# Patient Record
Sex: Female | Born: 1965 | Race: White | Hispanic: No | Marital: Married | Smoking: Never smoker
Health system: Southern US, Community
[De-identification: ages and names within clinical notes are randomized; demographics above are authoritative.]

## PROBLEM LIST (undated history)

## (undated) DIAGNOSIS — F4310 Post-traumatic stress disorder, unspecified: Secondary | ICD-10-CM

## (undated) DIAGNOSIS — I10 Essential (primary) hypertension: Secondary | ICD-10-CM

## (undated) DIAGNOSIS — F319 Bipolar disorder, unspecified: Secondary | ICD-10-CM

## (undated) DIAGNOSIS — F419 Anxiety disorder, unspecified: Secondary | ICD-10-CM

## (undated) DIAGNOSIS — N189 Chronic kidney disease, unspecified: Secondary | ICD-10-CM

## (undated) HISTORY — PX: BLADDER SURGERY: SHX569

## (undated) HISTORY — DX: Chronic kidney disease, unspecified: N18.9

## (undated) HISTORY — DX: Anxiety disorder, unspecified: F41.9

## (undated) HISTORY — DX: Bipolar disorder, unspecified: F31.9

## (undated) HISTORY — DX: Post-traumatic stress disorder, unspecified: F43.10

## (undated) HISTORY — DX: Essential (primary) hypertension: I10

## (undated) HISTORY — PX: OTHER SURGICAL HISTORY: SHX169

---

## 2009-10-10 ENCOUNTER — Emergency Department (HOSPITAL_COMMUNITY): Admission: EM | Admit: 2009-10-10 | Discharge: 2009-10-10 | Payer: Self-pay | Admitting: Emergency Medicine

## 2012-12-30 ENCOUNTER — Ambulatory Visit: Payer: Self-pay | Admitting: Adult Health

## 2013-12-20 LAB — CBC AND DIFFERENTIAL
HEMATOCRIT: 39 % (ref 36–46)
HEMOGLOBIN: 13.2 g/dL (ref 12.0–16.0)
Neutrophils Absolute: 2 /uL
Platelets: 300 10*3/uL (ref 150–399)
WBC: 3.8 10*3/mL

## 2013-12-20 LAB — HEMOGLOBIN A1C: Hemoglobin A1C: 5.2

## 2013-12-20 LAB — TSH: TSH: 1.74 u[IU]/mL (ref 0.41–5.90)

## 2014-01-17 LAB — HEPATIC FUNCTION PANEL
ALT: 21 U/L (ref 7–35)
AST: 27 U/L (ref 13–35)
Alkaline Phosphatase: 86 U/L (ref 25–125)
Bilirubin, Total: 0.6 mg/dL

## 2014-01-17 LAB — BASIC METABOLIC PANEL
BUN: 15 mg/dL (ref 4–21)
Creatinine: 1.5 mg/dL — AB (ref 0.5–1.1)
Glucose: 106 mg/dL
POTASSIUM: 4.8 mmol/L (ref 3.4–5.3)
SODIUM: 142 mmol/L (ref 137–147)

## 2014-04-24 ENCOUNTER — Ambulatory Visit: Payer: Self-pay | Admitting: Family Medicine

## 2014-05-09 ENCOUNTER — Ambulatory Visit (INDEPENDENT_AMBULATORY_CARE_PROVIDER_SITE_OTHER): Payer: Medicare Other | Admitting: Internal Medicine

## 2014-05-09 ENCOUNTER — Encounter (INDEPENDENT_AMBULATORY_CARE_PROVIDER_SITE_OTHER): Payer: Self-pay

## 2014-05-09 ENCOUNTER — Encounter: Payer: Self-pay | Admitting: Internal Medicine

## 2014-05-09 VITALS — BP 98/66 | HR 114 | Ht 63.5 in | Wt 169.0 lb

## 2014-05-09 DIAGNOSIS — R0600 Dyspnea, unspecified: Secondary | ICD-10-CM

## 2014-05-09 DIAGNOSIS — E669 Obesity, unspecified: Secondary | ICD-10-CM | POA: Insufficient documentation

## 2014-05-09 NOTE — Patient Instructions (Signed)
We will schedule you for a Pulmonary function test and a 6 min walk test. We will send you for a 2D echo at Cardiology next door to our office. We will see you back in 1 month.

## 2014-05-09 NOTE — Progress Notes (Signed)
Date: 05/09/2014  MRN# [id number] Shelley [name] 12-26-1965  [facility] (FNP: Loney LaurenceMargaret Campbell)  Shelley NimsMargaret G [name] is a 49 y.o. old female seen in consultation for dysypnea.  CC:  Chief Complaint  Patient presents with  . Advice Only    Pt has had sob for 6 weeks, non-productive cough, and wheezing. She denies chest tightness.    HPI:  Patient is a pleasant 49 year old female referred for further evaluation of dyspnea by [facility]. Patient is accompanied today by her fianc. History per the patient. Patient states the dyspnea is gradual in onset started about 4-6 weeks ago, has noticed some very mild leg swelling and mild wheezing (when laying flat).  The patient cannot recall any inciting factors for dyspnea 4-6 weeks ago. She states that walking up a flight of stairs or more 10-15 yards will cause her to become short of breath. She is currently on no oral contraceptives. She does have a mild cough, for which she states it is productive but swallows the sputum and can't characterize the sputum. Dyspnea again, has been gradual. Last week she saw her primary care office and was given one albuterol nebulizer treatment, with little relief; she was also given a prescription for albuterol inhaler but has not picked up yet.  Patient denies any previous episodes of dyspnea, history of pulmonary embolism or deep venous thrombosis.  She is a never smoker but was exposed to secondhand smoke from appearance for 35 years, and currently expose from her fianc.  PMHX:   Past Medical History  Diagnosis Date  . Hypertension   . PTSD (post-traumatic stress disorder)   . Bipolar 1 disorder   . Anxiety    Surgical Hx:  Past Surgical History  Procedure Laterality Date  . None     Family Hx:  No family history on file. Social Hx:   History  Substance Use Topics  . Smoking status: Never Smoker   . Smokeless tobacco: Never Used  .  Alcohol Use: No   Medication:   Current Outpatient Rx  Name  Route  Sig  Dispense  Refill  . ARIPiprazole (ABILIFY) 5 MG tablet   Oral   Take 5 mg by mouth daily.         Marland Kitchen. buPROPion (WELLBUTRIN SR) 150 MG 12 hr tablet   Oral   Take 150 mg by mouth 2 (two) times daily.         Marland Kitchen. lisinopril (PRINIVIL,ZESTRIL) 10 MG tablet   Oral   Take 10 mg by mouth daily.         . naproxen (NAPROSYN) 500 MG tablet   Oral   Take 500 mg by mouth daily as needed.         . prazosin (MINIPRESS) 1 MG capsule   Oral   Take 3 mg by mouth daily.         Marland Kitchen. topiramate (TOPAMAX) 100 MG tablet   Oral   Take 100 mg by mouth daily.         . traZODone (DESYREL) 100 MG tablet   Oral   Take 200 mg by mouth daily.         Marland Kitchen. venlafaxine XR (EFFEXOR-XR) 150 MG 24 hr capsule   Oral   Take 150 mg by mouth daily.             Allergies:  Review of patient's allergies indicates no known allergies.  Review of Systems: Gen:  Denies  fever, sweats, chills HEENT: Denies blurred vision, double vision, ear pain, eye pain, hearing loss, nose bleeds, sore throat Cvc:  No dizziness, chest pain or heaviness Resp:   Denies cough or sputum porduction, shortness of breath Gi: Denies swallowing difficulty, stomach pain, nausea or vomiting, diarrhea, constipation, bowel incontinence Gu:  Denies bladder incontinence, burning urine Ext:   No Joint pain, stiffness or swelling Skin: No skin rash, easy bruising or bleeding or hives Endoc:  No polyuria, polydipsia , polyphagia or weight change Psych: No depression, insomnia or hallucinations  Other:  All other systems negative  Physical Examination:   VS: BP 98/66 mmHg  Pulse 114  Ht 5' 3.5" (1.613 m)  Wt 169 lb (76.658 kg)  BMI 29.46 kg/m2  SpO2 98%  General Appearance: No distress  Neuro:without focal findings, mental status, speech normal, alert and oriented, cranial nerves 2-12 intact, reflexes normal and symmetric, sensation grossly normal   HEENT: PERRLA, EOM intact, no ptosis, no other lesions noticed; Mallampati 3 Pulmonary: normal breath sounds., diaphragmatic excursion normal.No wheezing, No rales;   Sputum Production:   CardiovascularNormal S1,S2.  No m/r/g.  Abdominal aorta pulsation normal.    Abdomen: Benign, Soft, non-tender, No masses, hepatosplenomegaly, No lymphadenopathy Renal:  No costovertebral tenderness  GU:  No performed at this time. Endoc: No evident thyromegaly, no signs of acromegaly or Cushing features Skin:   warm, no rashes, no ecchymosis  Extremities: normal, no cyanosis, clubbing, no edema, warm with normal capillary refill. Other findings:none    Rad results:  (The following images and results were reviewed by Dr. Dema Severin).  CXR 2 View 04/24/14 FINDINGS: Midline trachea. Normal heart size and mediastinal contours. No pleural effusion or pneumothorax. Clear lungs.  IMPRESSION: No acute cardiopulmonary disease.   Assessment and Plan: No problem-specific assessment & plan notes found for this encounter.   Updated Medication List Outpatient Encounter Prescriptions as of 05/09/2014  Medication Sig  . ARIPiprazole (ABILIFY) 5 MG tablet Take 5 mg by mouth daily.  Marland Kitchen buPROPion (WELLBUTRIN SR) 150 MG 12 hr tablet Take 150 mg by mouth 2 (two) times daily.  Marland Kitchen lisinopril (PRINIVIL,ZESTRIL) 10 MG tablet Take 10 mg by mouth daily.  . naproxen (NAPROSYN) 500 MG tablet Take 500 mg by mouth daily as needed.  . prazosin (MINIPRESS) 1 MG capsule Take 3 mg by mouth daily.  Marland Kitchen topiramate (TOPAMAX) 100 MG tablet Take 100 mg by mouth daily.  . traZODone (DESYREL) 100 MG tablet Take 200 mg by mouth daily.  Marland Kitchen venlafaxine XR (EFFEXOR-XR) 150 MG 24 hr capsule Take 150 mg by mouth daily.    Orders for this visit: Orders Placed This Encounter  Procedures  . 2D Echocardiogram without contrast    Standing Status: Future     Number of Occurrences:      Standing Expiration Date: 05/10/2015    Scheduling Instructions:      Procedure to be done at [facility].    Order Specific Question:  Type of Echo    Answer:  Complete    Order Specific Question:  Where should this test be performed    Answer:  Other    Order Specific Question:  Reason for exam-Echo    Answer:  Dyspnea  786.09 / R06.00  . Pulmonary function test    Standing Status: Future     Number of Occurrences:      Standing Expiration Date: 05/10/2015    Scheduling Instructions:     To be done in  [place]. This was not scheduled, patient needs an AM appt, not sure when Lashun Mccants next morning is. Recall entered.    Order Specific Question:  Where should this test be performed?    Answer:  Other    Order Specific Question:  Full PFT: includes the following: basic spirometry, spirometry pre & post bronchodilator, diffusion capacity (DLCO), lung volumes    Answer:  Full PFT    Order Specific Question:  MIP/MEP    Answer:  No    Order Specific Question:  6 minute walk    Answer:  Yes    Order Specific Question:  ABG    Answer:  No    Order Specific Question:  Diffusion capacity (DLCO)    Answer:  No    Order Specific Question:  Lung volumes    Answer:  No    Order Specific Question:  Methacholine challenge    Answer:  No     Thank  you for the consultation and for allowing [facility] to assist in the care of your patient. Our recommendations are noted above.  Please contact us if we can be of further service.   Stephanie Acre, MD [facility] Office Number: [phone]

## 2014-05-09 NOTE — Assessment & Plan Note (Signed)
OBESITY  Wt: 169lbs BMI: 29 Discussed importance of weight reduction.  Educated regarding limitation of  intake of greasy/fried foods.  Instructed on benefit of  a low-impact exercise program, starting slowly.  Discussed benefits of 30-45 minutes of some form of exercise daily as well as benefit of supervised exercise program.

## 2014-05-09 NOTE — Assessment & Plan Note (Signed)
Differential diagnosis includes:  Obstructive versus restrictive disease, obstructive sleep apnea, obesity, deconditioning.  At this time there is no single etiology to her dyspnea, further workup as stated below.  Plan: -Pulmonary function testing, 6 minute walk test -2-D echo to further evaluate cardiac anatomy. -Discussed OSA at next visit. -Patient educated on proper use, administration, and technique of albuterol inhaler.

## 2014-05-22 ENCOUNTER — Other Ambulatory Visit: Payer: Medicaid Other

## 2014-05-31 ENCOUNTER — Other Ambulatory Visit: Payer: Medicaid Other

## 2014-06-07 ENCOUNTER — Other Ambulatory Visit: Payer: Self-pay

## 2014-06-07 ENCOUNTER — Other Ambulatory Visit (HOSPITAL_COMMUNITY): Payer: Self-pay | Admitting: Cardiology

## 2014-06-07 ENCOUNTER — Other Ambulatory Visit (INDEPENDENT_AMBULATORY_CARE_PROVIDER_SITE_OTHER): Payer: Medicare Other

## 2014-06-07 DIAGNOSIS — R0600 Dyspnea, unspecified: Secondary | ICD-10-CM

## 2015-01-14 ENCOUNTER — Ambulatory Visit: Payer: Medicare Other | Admitting: Licensed Clinical Social Worker

## 2015-04-16 ENCOUNTER — Ambulatory Visit (INDEPENDENT_AMBULATORY_CARE_PROVIDER_SITE_OTHER): Payer: Medicare Other | Admitting: Licensed Clinical Social Worker

## 2015-04-16 DIAGNOSIS — F329 Major depressive disorder, single episode, unspecified: Secondary | ICD-10-CM | POA: Diagnosis not present

## 2015-04-16 DIAGNOSIS — F32A Depression, unspecified: Secondary | ICD-10-CM

## 2015-04-16 NOTE — Progress Notes (Signed)
Patient:   Shelley [name]   DOB:   Dec 28, 1965  MR Number:  [id number]  Location:  [facility] [address] [place] [address] Dept: [phone]           Date of Service:   04/16/2015  Start Time:   3p End Time:   315p  Provider/Observer:  Marinda ElkNicole M Urijah Raynor Counselor       Billing Code/Service: No billable   Patient refused to continue with services after learning that Writer is pregnant and will go on maternity leave in February/March.  She stated "I'm tired of being bounced around."

## 2015-06-06 ENCOUNTER — Encounter: Payer: Self-pay | Admitting: Psychiatry

## 2015-06-06 ENCOUNTER — Ambulatory Visit (INDEPENDENT_AMBULATORY_CARE_PROVIDER_SITE_OTHER): Payer: Medicare Other | Admitting: Psychiatry

## 2015-06-06 VITALS — BP 118/62 | HR 139 | Temp 97.9°F | Ht 63.5 in | Wt 150.2 lb

## 2015-06-06 DIAGNOSIS — F3160 Bipolar disorder, current episode mixed, unspecified: Secondary | ICD-10-CM

## 2015-06-06 MED ORDER — QUETIAPINE FUMARATE 100 MG PO TABS: 100.0000 mg | ORAL_TABLET | Freq: Every day | ORAL | Status: DC

## 2015-06-06 MED ORDER — BUPROPION HCL 75 MG PO TABS: 75.0000 mg | ORAL_TABLET | Freq: Every morning | ORAL | Status: DC

## 2015-06-06 NOTE — Progress Notes (Signed)
Psychiatric Initial Adult Assessment   Patient Identification: Shelley [name] MRN:  [id number] Date of Evaluation:  06/06/2015 Referral Source: PCP.  Chief Complaint:   Chief Complaint    Establish Care; Anxiety; Depression; Panic Attack; Fatigue; Stress; Insomnia     Visit Diagnosis:    ICD-9-CM ICD-10-CM   1. Bipolar affective disorder, current episode mixed, current episode severity unspecified (HCC) 296.60 F31.60    Diagnosis:   Patient Active Problem List   Diagnosis Date Noted  . Dyspnea [R06.00] 05/09/2014  . Obesity [E66.9] 05/09/2014   History of Present Illness:  Patient is a 50 year old female who presented for the initial assessment. She reported that she is currently following with her primary care physician and was suggested to make an appointment at this office. She reported that she has been following Dr.Litz at [facility] for the past year. She reported that she has been diagnosed with bipolar disorder PTSD and was given combination of Wellbutrin 300 mg in the morning prazosin and trazodone. She reported that she currently she has crying spells feeling agitated and angry and upset over small things. She reported that initially she was following Dr. Elesa Massed at [place] [facility] but since he left and moved she decided to come to [facility]. She was also following a therapist over there but her therapist decided to leave the practice. When she came to her office she was asking about the presence of any therapist over here. Patient was tearful during part of the interview. She reported that she cries for no reasons. She reported that she does not sleep well at night and feels that her mind is racing most of the time. She reported that she currently lives by herself and has homosexual friend who is very supportive. 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 She currently denied having any suicidal ideations or plans. She is open to suggestion and her  medication adjustment.   Elements:  Severity:  moderate . Associated Signs/Symptoms: Depression Symptoms:  depressed mood, insomnia, fatigue, feelings of worthlessness/guilt, difficulty concentrating, hopelessness, anxiety, disturbed sleep, decreased appetite, (Hypo) Manic Symptoms:  Irritable Mood, Labiality of Mood, Anxiety Symptoms:  Excessive Worry, Psychotic Symptoms:  none PTSD Symptoms: Negative NA  Past Medical History:  Past Medical History  Diagnosis Date  . Hypertension   . PTSD (post-traumatic stress disorder)   . Bipolar 1 disorder (HCC)   . Anxiety   . Chronic kidney disease     Past Surgical History  Procedure Laterality Date  . None    . Bladder surgery     Family History:  Family History  Problem Relation Age of Onset  . Cancer Mother   . Alcohol abuse Mother   . Heart attack Father   . Cancer Father   . Depression Sister   . Hypertension Brother   . Diabetes Brother   . Drug abuse Sister   . Alcohol abuse Sister   . Obesity Sister   . Anxiety disorder Sister   . Depression Sister   . Hypertension Brother   . Depression Brother   . Alcohol abuse Brother    Social History:   Social History   Social History  . Marital Status: Divorced    Spouse Name: N/A  . Number of Children: 3  . Years of Education: N/A   Social History Main Topics  . Smoking status: Never Smoker   . Smokeless tobacco: Never Used  . Alcohol Use:  No  . Drug Use: No  . Sexual Activity: Not Currently    Birth Control/ Protection: None   Other Topics Concern  . None   Social History Narrative   Additional Social History:  Patient reported that she has relocated from [place] 4 years ago. Her ex-husband lives still there.  Musculoskeletal: Strength & Muscle Tone: within normal limits Gait & Station: normal Patient leans: N/A  Psychiatric Specialty Exam: HPI  ROS  Blood pressure 118/62, pulse 139, temperature 97.9 F (36.6 C), temperature source  Tympanic, height 5' 3.5" (1.613 m), weight 150 lb 3.2 oz (68.13 kg), SpO2 95 %.Body mass index is 26.19 kg/(m^2).  General Appearance: Casual  Eye Contact:  Fair  Speech:  Clear and Coherent  Volume:  Normal  Mood:  Anxious and Dysphoric  Affect:  Congruent and Tearful  Thought Process:  Coherent  Orientation:  Full (Time, Place, and Person)  Thought Content:  WDL  Suicidal Thoughts:  No  Homicidal Thoughts:  No  Memory:  Immediate;   Fair  Judgement:  Intact  Insight:  Fair  Psychomotor Activity:  Normal  Concentration:  Fair  Recall:  [employer] of Knowledge:Fair  Language: Fair  Akathisia:  No  Handed:  Right  AIMS (if indicated):    Assets:  Communication Skills Desire for Improvement Physical Health Social Support  ADL's:  Intact  Cognition: WNL  Sleep:  2-3   Is the patient at risk to self?  No. Has the patient been a risk to self in the past 6 months?  No. Has the patient been a risk to self within the distant past?  Yes.   Is the patient a risk to others?  No. Has the patient been a risk to others in the past 6 months?  No. Has the patient been a risk to others within the distant past?  No.  Allergies:  No Known Allergies Current Medications: Current Outpatient Prescriptions  Medication Sig Dispense Refill  . albuterol (PROVENTIL HFA;VENTOLIN HFA) 108 (90 Base) MCG/ACT inhaler Inhale into the lungs every 6 (six) hours as needed for wheezing or shortness of breath.    Marland Kitchen buPROPion (WELLBUTRIN SR) 150 MG 12 hr tablet Take 150 mg by mouth 2 (two) times daily.    . cetirizine (ZYRTEC) 10 MG tablet Take 10 mg by mouth daily.    Marland Kitchen lisinopril (PRINIVIL,ZESTRIL) 10 MG tablet Take 10 mg by mouth daily.    Marland Kitchen LORazepam (ATIVAN) 0.5 MG tablet Take 0.5 mg by mouth every 8 (eight) hours.    . Magnesium 250 MG TABS Take by mouth.    . Multiple Vitamin (MULTI-VITAMINS) TABS Take by mouth.    . naproxen (NAPROSYN) 500 MG tablet Take 500 mg by mouth daily as needed.    .  potassium gluconate 595 (99 K) MG TABS tablet Take 595 mg by mouth.    . prazosin (MINIPRESS) 1 MG capsule Take 3 mg by mouth daily.    . ranitidine (ZANTAC) 150 MG capsule Take 150 mg by mouth 2 (two) times daily.    Marland Kitchen spironolactone (ALDACTONE) 25 MG tablet Take 25 mg by mouth daily.    Marland Kitchen topiramate (TOPAMAX) 100 MG tablet Take 100 mg by mouth daily.    . traZODone (DESYREL) 100 MG tablet Take 200 mg by mouth daily.    . valACYclovir (VALTREX) 1000 MG tablet Take 1,000 mg by mouth 2 (two) times daily.    . vitamin B-12 (CYANOCOBALAMIN) 100 MCG tablet Take 100  mcg by mouth daily.     No current facility-administered medications for this visit.    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 [place]  She moved to [place] 5 years ago. Divorced x 4 years  Has 3 daughters- all in college. Lives by self , on SSI.   Brother died of alcoholism at 54.    Substance Abuse History in the last 12 months:  no   Consequences of Substance Abuse: Negative NA  Medical Decision Making:  Review of Psycho-Social Stressors (1) and Review and summation of old records (2)  Treatment Plan Summary: Medication management   Discussed with patient about her medications. I will start her on Wellbutrin 75 mg in the morning I will also start her on Seroquel 50 mg at bedtime and discussed with her about the adverse effects in detail and she demonstrated understanding. She will continue on prazosin at bedtime Follow-up in 1 month   More than 50% of the time spent in psychoeducation, counseling and coordination of care.    This note was generated in part or whole with voice recognition software. Voice regonition is usually quite accurate but there are transcription errors that can and very often do occur. I apologize for any typographical errors that were not detected and corrected.    Brandy Hale, MD   2/16/20172:26  PM

## 2015-06-07 ENCOUNTER — Telehealth: Payer: Self-pay

## 2015-06-07 NOTE — Telephone Encounter (Signed)
the pharmacy called because they need to clarify rx.  pt was already on  of bupropion from another doctor, they need to know if you are adding your rx to what already given

## 2015-06-13 NOTE — Telephone Encounter (Signed)
I had given pt written instructions. Advised her to start only Wellbutrin 75 mg in AM. She needs to STOP  dose.

## 2015-07-04 ENCOUNTER — Ambulatory Visit (INDEPENDENT_AMBULATORY_CARE_PROVIDER_SITE_OTHER): Payer: Medicare HMO | Admitting: Psychiatry

## 2015-07-04 ENCOUNTER — Encounter: Payer: Self-pay | Admitting: Psychiatry

## 2015-07-04 VITALS — BP 110/72 | HR 105 | Temp 98.2°F | Ht 63.5 in | Wt 151.0 lb

## 2015-07-04 DIAGNOSIS — F3160 Bipolar disorder, current episode mixed, unspecified: Secondary | ICD-10-CM

## 2015-07-04 MED ORDER — BUPROPION HCL 100 MG PO TABS: 100.0000 mg | ORAL_TABLET | Freq: Every morning | ORAL | Status: DC

## 2015-07-04 MED ORDER — TOPIRAMATE 100 MG PO TABS: 100.0000 mg | ORAL_TABLET | Freq: Every day | ORAL | Status: DC

## 2015-07-04 MED ORDER — PRAZOSIN HCL 2 MG PO CAPS: 2.0000 mg | ORAL_CAPSULE | Freq: Every day | ORAL | Status: DC

## 2015-07-04 MED ORDER — QUETIAPINE FUMARATE 100 MG PO TABS: 150.0000 mg | ORAL_TABLET | Freq: Every day | ORAL | Status: DC

## 2015-07-04 NOTE — Progress Notes (Signed)
Psychiatric MD Progress Note   Patient Identification: Shelley NimsMargaret G Maves MRN:  [id number] Date of Evaluation:  07/04/2015 Referral Source: PCP.  Chief Complaint:   Chief Complaint    Follow-up; Medication Refill     Visit Diagnosis:    ICD-9-CM ICD-10-CM   1. Bipolar affective disorder, current episode mixed, current episode severity unspecified (HCC) 296.60 F31.60    Diagnosis:   Patient Active Problem List   Diagnosis Date Noted  . Dyspnea [R06.00] 05/09/2014  . Obesity [E66.9] 05/09/2014   History of Present Illness:  Patient is a 50 year old female who presented for Her appointment. She reported that she is feeling as stressed out for the past week as this is the death anniversary of several of her family members who passed away during this month. She reported that she is really feeling depressed hopeless and was crying. She was also tearful during the interview. She reported that she is taking her medications as prescribed and reported that they are helpful. She was also excited that she has not gained any weight during this past month. She reported that she would like to have the medications adjusted as she feels that the current medications are helping her. Patient reported that she is also looking for a therapist but was unable to find anyone in the past month as they're not accepting her insurance. She'll also mentioned that she has been sleeping well with the help of Seroquel at night. She currently denied having any suicidal ideations or plans but reported that she was having depressive feelings during the past week and was thinking about admitting herself to the inpatient behavioral health unit. However she reported that she does not have any thoughts at this time and would like to have her medications adjusted. She remains anxious and reported that she would like to start seeing the therapist at our office from the next week.  Patient was tearful during part of the interview. She  reported that she cries for no reasons. She currently denied having any suicidal ideations or plans.  Elements:  Severity:  moderate . Associated Signs/Symptoms: Depression Symptoms:  depressed mood, insomnia, fatigue, feelings of worthlessness/guilt, difficulty concentrating, hopelessness, anxiety, disturbed sleep, decreased appetite, (Hypo) Manic Symptoms:  Irritable Mood, Labiality of Mood, Anxiety Symptoms:  Excessive Worry, Psychotic Symptoms:  none PTSD Symptoms: Negative NA  Past Medical History:  Past Medical History  Diagnosis Date  . Hypertension   . PTSD (post-traumatic stress disorder)   . Bipolar 1 disorder (HCC)   . Anxiety   . Chronic kidney disease     Past Surgical History  Procedure Laterality Date  . None    . Bladder surgery     Family History:  Family History  Problem Relation Age of Onset  . Cancer Mother   . Alcohol abuse Mother   . Heart attack Father   . Cancer Father   . Depression Sister   . Hypertension Brother   . Diabetes Brother   . Drug abuse Sister   . Alcohol abuse Sister   . Obesity Sister   . Anxiety disorder Sister   . Depression Sister   . Hypertension Brother   . Depression Brother   . Alcohol abuse Brother    Social History:   Social History   Social History  . Marital Status: Divorced    Spouse Name: N/A  . Number of Children: 3  . Years of Education: N/A   Social History Main Topics  . Smoking status: Never Smoker   .  Smokeless tobacco: Never Used  . Alcohol Use: No  . Drug Use: No  . Sexual Activity: Not Currently    Birth Control/ Protection: None   Other Topics Concern  . None   Social History Narrative   Additional Social History:  Patient reported that she has relocated from [place] 4 years ago. Her ex-husband lives still there.  Musculoskeletal: Strength & Muscle Tone: within normal limits Gait & Station: normal Patient leans: N/A  Psychiatric Specialty Exam: HPI   ROS   Blood  pressure 110/72, pulse 105, temperature 98.2 F (36.8 C), temperature source Tympanic, height 5' 3.5" (1.613 m), weight 151 lb (68.493 kg), SpO2 99 %.Body mass index is 26.33 kg/(m^2).  General Appearance: Casual  Eye Contact:  Fair  Speech:  Clear and Coherent  Volume:  Normal  Mood:  Anxious and Dysphoric  Affect:  Congruent and Tearful  Thought Process:  Coherent  Orientation:  Full (Time, Place, and Person)  Thought Content:  WDL  Suicidal Thoughts:  No  Homicidal Thoughts:  No  Memory:  Immediate;   Fair  Judgement:  Intact  Insight:  Fair  Psychomotor Activity:  Normal  Concentration:  Fair  Recall:  [employer] of Knowledge:Fair  Language: Fair  Akathisia:  No  Handed:  Right  AIMS (if indicated):    Assets:  Communication Skills Desire for Improvement Physical Health Social Support  ADL's:  Intact  Cognition: WNL  Sleep:  2-3   Is the patient at risk to self?  No. Has the patient been a risk to self in the past 6 months?  No. Has the patient been a risk to self within the distant past?  Yes.   Is the patient a risk to others?  No. Has the patient been a risk to others in the past 6 months?  No. Has the patient been a risk to others within the distant past?  No.  Allergies:  No Known Allergies Current Medications: Current Outpatient Prescriptions  Medication Sig Dispense Refill  . albuterol (PROVENTIL HFA;VENTOLIN HFA) 108 (90 Base) MCG/ACT inhaler Inhale into the lungs every 6 (six) hours as needed for wheezing or shortness of breath.    Marland Kitchen buPROPion (WELLBUTRIN) 75 MG tablet Take 1 tablet (75 mg total) by mouth every morning. 30 tablet 1  . cetirizine (ZYRTEC) 10 MG tablet Take 10 mg by mouth daily.    Marland Kitchen lisinopril (PRINIVIL,ZESTRIL) 10 MG tablet Take 10 mg by mouth daily.    . Magnesium 250 MG TABS Take by mouth.    . Multiple Vitamin (MULTI-VITAMINS) TABS Take by mouth.    . naproxen (NAPROSYN) 500 MG tablet Take 500 mg by mouth daily as needed.    .  potassium gluconate 595 (99 K) MG TABS tablet Take 595 mg by mouth.    . prazosin (MINIPRESS) 1 MG capsule Take 3 mg by mouth daily.    . QUEtiapine (SEROQUEL) 100 MG tablet Take 1 tablet (100 mg total) by mouth at bedtime. 30 tablet 1  . ranitidine (ZANTAC) 150 MG capsule Take 150 mg by mouth 2 (two) times daily.    Marland Kitchen spironolactone (ALDACTONE) 25 MG tablet Take 25 mg by mouth daily.    Marland Kitchen topiramate (TOPAMAX) 100 MG tablet Take 100 mg by mouth daily.    . valACYclovir (VALTREX) 1000 MG tablet Take 1,000 mg by mouth 2 (two) times daily.    . vitamin B-12 (CYANOCOBALAMIN) 100 MCG tablet Take 100 mcg by mouth daily.  No current facility-administered medications for this visit.    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 [place]  She moved to [place] 5 years ago. Divorced x 4 years  Has 3 daughters- all in college. Lives by self , on SSI.   Brother died of alcoholism at 47.    Substance Abuse History in the last 12 months:  no   Consequences of Substance Abuse: Negative NA  Medical Decision Making:  Review of Psycho-Social Stressors (1) and Review and summation of old records (2)  Treatment Plan Summary: Medication management   Discussed with patient about her medications. I will start her on Wellbutrin 100 mg in the morning I will also start her on Seroquel 150 mg at bedtime and discussed with her about the adverse effects in detail and she demonstrated understanding. She will continue on prazosin 2 mg  at bedtime Patient is also taking Topamax 100 mg at bedtime Follow-up in 3 weeks    More than 50% of the time spent in psychoeducation, counseling and coordination of care.    This note was generated in part or whole with voice recognition software. Voice regonition is usually quite accurate but there are transcription errors that can and very often do occur. I apologize for any  typographical errors that were not detected and corrected.    Brandy Hale, MD   3/16/20172:19 PM

## 2015-07-11 ENCOUNTER — Encounter: Payer: Self-pay | Admitting: Licensed Clinical Social Worker

## 2015-07-11 ENCOUNTER — Ambulatory Visit (INDEPENDENT_AMBULATORY_CARE_PROVIDER_SITE_OTHER): Payer: Medicare HMO | Admitting: Licensed Clinical Social Worker

## 2015-07-11 DIAGNOSIS — F319 Bipolar disorder, unspecified: Secondary | ICD-10-CM | POA: Insufficient documentation

## 2015-07-11 DIAGNOSIS — F411 Generalized anxiety disorder: Secondary | ICD-10-CM

## 2015-07-11 DIAGNOSIS — F3163 Bipolar disorder, current episode mixed, severe, without psychotic features: Secondary | ICD-10-CM | POA: Diagnosis not present

## 2015-07-11 DIAGNOSIS — F4310 Post-traumatic stress disorder, unspecified: Secondary | ICD-10-CM | POA: Diagnosis not present
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
## 2015-07-18 ENCOUNTER — Ambulatory Visit: Payer: Medicare Other | Admitting: Licensed Clinical Social Worker

## 2015-07-23 ENCOUNTER — Encounter: Payer: Self-pay | Admitting: Psychiatry

## 2015-07-23 ENCOUNTER — Ambulatory Visit (INDEPENDENT_AMBULATORY_CARE_PROVIDER_SITE_OTHER): Payer: Medicare HMO | Admitting: Psychiatry

## 2015-07-23 VITALS — BP 124/82 | HR 90 | Temp 98.3°F | Ht 63.5 in | Wt 152.0 lb

## 2015-07-23 DIAGNOSIS — F3163 Bipolar disorder, current episode mixed, severe, without psychotic features: Secondary | ICD-10-CM | POA: Diagnosis not present

## 2015-07-23 DIAGNOSIS — F4310 Post-traumatic stress disorder, unspecified: Secondary | ICD-10-CM

## 2015-07-23 MED ORDER — PRAZOSIN HCL 2 MG PO CAPS: 2.0000 mg | ORAL_CAPSULE | Freq: Every day | ORAL | Status: DC

## 2015-07-23 MED ORDER — TRAZODONE HCL 100 MG PO TABS: 200.0000 mg | ORAL_TABLET | Freq: Every day | ORAL | Status: DC

## 2015-07-23 MED ORDER — ESCITALOPRAM OXALATE 10 MG PO TABS: 10.0000 mg | ORAL_TABLET | Freq: Every day | ORAL | Status: DC

## 2015-07-23 MED ORDER — LAMOTRIGINE 25 MG PO TABS: 25.0000 mg | ORAL_TABLET | Freq: Every day | ORAL | Status: DC

## 2015-07-23 NOTE — Progress Notes (Signed)
Psychiatric MD Progress Note   Patient Identification: Shelley [name] MRN:  [id number] Date of Evaluation:  07/23/2015 Referral Source: PCP.  Chief Complaint:  Follow up Chief Complaint    Follow-up; Medication Refill     Visit Diagnosis:    ICD-9-CM ICD-10-CM   1. Bipolar disorder, current episode mixed, severe, without psychotic features (HCC) 296.63 F31.63   2. PTSD (post-traumatic stress disorder) 309.81 F43.10    Diagnosis:   Patient Active Problem List   Diagnosis Date Noted  . Bipolar disorder (HCC) [F31.9] 07/11/2015  . PTSD (post-traumatic stress disorder) [F43.10] 07/11/2015  . Generalized anxiety disorder [F41.1] 07/11/2015  . Dyspnea [R06.00] 05/09/2014  . Obesity [E66.9] 05/09/2014   History of Present Illness:  Patient is a 50 year old female who presented for follow  appointment. She reported that she Stopped taking the Seroquel as she was gaining weight. She reported that she does not want to gain any weight and was tearful during the interview. She appeared hyper as well. She  is very much focused on her weight issues and does not want to take any medication which can cause weight gain. She reported that she has been taking Wellbutrin as prescribed. She stated that she has been trying to lose weight for the past so many years. Patient reported that the medications are not helping her as she continues to stay related and depressed. She is trying to stay away from her neighbors. She has found some planters,  and painted them and is trying to plant some vegetables and flowers at her home.   We discussed about the medications in detail and she agreed to a trial of lamotrigine and Lexapro at this time. She also wants to take trazodone to help her sleep at night. She currently denied having any perceptual disturbances She  denied having any suicidal homicidal ideations or plans.  Patient was tearful during part of the interview. She reported that she cries for no  reasons. Elements:  Severity:  moderate . Associated Signs/Symptoms: Depression Symptoms:  depressed mood, insomnia, fatigue, feelings of worthlessness/guilt, difficulty concentrating, hopelessness, anxiety, disturbed sleep, decreased appetite, (Hypo) Manic Symptoms:  Irritable Mood, Labiality of Mood, Anxiety Symptoms:  Excessive Worry, Psychotic Symptoms:  none PTSD Symptoms: Negative NA  Past Medical History:  Past Medical History  Diagnosis Date  . Hypertension   . PTSD (post-traumatic stress disorder)   . Bipolar 1 disorder (HCC)   . Anxiety   . Chronic kidney disease     Past Surgical History  Procedure Laterality Date  . None    . Bladder surgery     Family History:  Family History  Problem Relation Age of Onset  . Cancer Mother   . Alcohol abuse Mother   . Heart attack Father   . Cancer Father   . Depression Sister   . Hypertension Brother   . Diabetes Brother   . Drug abuse Sister   . Alcohol abuse Sister   . Obesity Sister   . Anxiety disorder Sister   . Depression Sister   . Hypertension Brother   . Depression Brother   . Alcohol abuse Brother    Social History:   Social History   Social History  . Marital Status: Divorced    Spouse Name: N/A  . Number of Children: 3  . Years of Education: N/A   Social History Main Topics  . Smoking status: Never Smoker   . Smokeless tobacco: Never Used  . Alcohol Use: No  . Drug  Use: No  . Sexual Activity: Not Currently    Birth Control/ Protection: None   Other Topics Concern  . None   Social History Narrative   Additional Social History:  Patient reported that she has relocated from [place] 4 years ago. Her ex-husband lives still there.  Musculoskeletal: Strength & Muscle Tone: within normal limits Gait & Station: normal Patient leans: N/A  Psychiatric Specialty Exam: HPI  ROS  Blood pressure 124/82, pulse 90, temperature 98.3 F (36.8 C), temperature source Tympanic, height 5' 3.5"  (1.613 m), weight 152 lb (68.947 kg), SpO2 97 %.Body mass index is 26.5 kg/(m^2).  General Appearance: Casual  Eye Contact:  Fair  Speech:  Clear and Coherent  Volume:  Normal  Mood:  Anxious and Dysphoric  Affect:  Congruent and Tearful  Thought Process:  Coherent  Orientation:  Full (Time, Place, and Person)  Thought Content:  WDL  Suicidal Thoughts:  No  Homicidal Thoughts:  No  Memory:  Immediate;   Fair  Judgement:  Intact  Insight:  Fair  Psychomotor Activity:  Normal  Concentration:  Fair  Recall:  [employer] of Knowledge:Fair  Language: Fair  Akathisia:  No  Handed:  Right  AIMS (if indicated):    Assets:  Communication Skills Desire for Improvement Physical Health Social Support  ADL's:  Intact  Cognition: WNL  Sleep:  2-3   Is the patient at risk to self?  No. Has the patient been a risk to self in the past 6 months?  No. Has the patient been a risk to self within the distant past?  Yes.   Is the patient a risk to others?  No. Has the patient been a risk to others in the past 6 months?  No. Has the patient been a risk to others within the distant past?  No.  Allergies:  No Known Allergies Current Medications: Current Outpatient Prescriptions  Medication Sig Dispense Refill  . buPROPion (WELLBUTRIN) 75 MG tablet     . lisinopril (PRINIVIL,ZESTRIL) 10 MG tablet Take 10 mg by mouth daily.    . Magnesium 250 MG TABS Take by mouth.    . Multiple Vitamin (MULTI-VITAMINS) TABS Take by mouth.    . naproxen (NAPROSYN) 500 MG tablet Take 500 mg by mouth daily as needed.    . potassium gluconate 595 (99 K) MG TABS tablet Take 595 mg by mouth.    . prazosin (MINIPRESS) 2 MG capsule Take 1 capsule (2 mg total) by mouth at bedtime. 30 capsule 1  . spironolactone (ALDACTONE) 25 MG tablet Take 25 mg by mouth daily.    Marland Kitchen topiramate (TOPAMAX) 100 MG tablet Take 1 tablet (100 mg total) by mouth daily. (Patient taking differently: Take 100 mg by mouth 2 (two) times daily. ) 30  tablet 1  . valACYclovir (VALTREX) 1000 MG tablet Take 1,000 mg by mouth 2 (two) times daily.    . vitamin B-12 (CYANOCOBALAMIN) 100 MCG tablet Take 100 mcg by mouth daily.     No current facility-administered medications for this visit.    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 [place]  She moved to [place] 5 years ago. Divorced x 4 years  Has 3 daughters- all in college. Lives by self , on SSI.   Brother died of alcoholism at 68.    Substance Abuse History in the last 12 months:  no   Consequences  of Substance Abuse: Negative NA  Medical Decision Making:  Review of Psycho-Social Stressors (1) and Review and summation of old records (2)  Treatment Plan Summary: Medication management   Discussed with patient about her medications. Patient will start taking Lexapro 10 mg daily for depression and anxiety. She will be also started on lamotrigine 25 mg daily for mood stabilization. She will be given trazodone 200 mg at bedtime for insomnia. She is going to follow with a therapist on a weekly basis. She will continue on prazosin 2 mg  at bedtime Patient is also taking Topamax 100 mg at bedtime Follow-up in a month   More than 50% of the time spent in psychoeducation, counseling and coordination of care.    This note was generated in part or whole with voice recognition software. Voice regonition is usually quite accurate but there are transcription errors that can and very often do occur. I apologize for any typographical errors that were not detected and corrected.    Brandy HaleUzma Yamin Swingler, MD    4/4/201711:47 AM

## 2015-07-25 ENCOUNTER — Ambulatory Visit (INDEPENDENT_AMBULATORY_CARE_PROVIDER_SITE_OTHER): Payer: Medicare HMO | Admitting: Licensed Clinical Social Worker

## 2015-07-25 DIAGNOSIS — F4310 Post-traumatic stress disorder, unspecified: Secondary | ICD-10-CM | POA: Diagnosis not present

## 2015-07-25 DIAGNOSIS — F411 Generalized anxiety disorder: Secondary | ICD-10-CM | POA: Diagnosis not present

## 2015-07-25 DIAGNOSIS — F3163 Bipolar disorder, current episode mixed, severe, without psychotic features: Secondary | ICD-10-CM

## 2015-07-25 NOTE — Progress Notes (Signed)
   THERAPIST PROGRESS NOTE  Session Time: 1:05 PM-1:55 PM  Participation Level: Active  Behavioral Response: CasualAlertEuthymic  Type of Therapy: Individual Therapy  Treatment Goals addressed: Communication: skills, Coping and Diagnosis: PTSD, Bipolar Disorder, most recent episode depressed  Interventions: DBT, Supportive and Reframing, coping skills for conflict  Summary: Shelley [name] is a 50 y.o. female who presents related that she is working on getting a car. Explained it would cause less stress. Related that her daughter "unfriended her" on Facebook. Shared that she regretted the divorce. Therapist discussed radical acceptance and that negative judgments of self can keep us stuck. It helps to focus on what we can do now. Therapist encouraged her to think about things she could do to enhance her life now. Related feels guilty when she is enjoying herself. She goes to [employer] and has moved away from [employer]. She goes to the prayer group. Therapist pointed out lack of control over situation with daugther and focus on herself. Reviewed session. Explained that we talked about her daughter and coping skills with relationship. Therapist pointed out her daughter actions are related to age. Patient processed ways to handle it. Completed treatment plan and patient wants to work on trust and letting her guard down..   Suicidal/Homicidal: No  Therapist Response: LCSW processed with patient ways to handle conflict with her daughter. Therapist helped patient to reframe situation so patient would be as distressed by it. Therapist introduced concept of radical acceptance and that suffering comes through making judgements so it helps to accept things as they are so one can focus on what one can do now. Therapist pointed out patient's tendency to feel guilty and worked on providing insight that this is a distortion. Therapist provided supportive interventions and encouraged patient  to focus on herself.   Plan: Return again in 1 weeks.  Diagnosis: Axis I: Bipolar, Depressed, PTSD    Axis II: n/a    [name] A 07/25/2015

## 2015-08-01 ENCOUNTER — Ambulatory Visit: Payer: Medicaid Other | Admitting: Licensed Clinical Social Worker

## 2015-08-01 ENCOUNTER — Other Ambulatory Visit: Payer: Self-pay

## 2015-08-01 NOTE — Telephone Encounter (Signed)
received a request for new rx with the correct amount pt was only given a 15 day supply needs to be a full 30 day supply.  pt was topiramate 100mg  take one bid  #60.

## 2015-08-01 NOTE — Telephone Encounter (Signed)
medication issues with lexapro.  pt states she has had nausea and vomiting with this medication.  she wants to stop this medication and go back on wellburtin.

## 2015-08-01 NOTE — Telephone Encounter (Signed)
received a request for new rx with the correct amount pt was only given a 15 day supply needs to be a full 30 day supply.  pt was topiramate 100mg take one bid  #60.  

## 2015-08-05 MED ORDER — TOPIRAMATE 100 MG PO TABS: 100.0000 mg | ORAL_TABLET | Freq: Two times a day (BID) | ORAL | Status: DC

## 2015-08-05 NOTE — Telephone Encounter (Signed)
topamax BID refilled

## 2015-08-07 ENCOUNTER — Other Ambulatory Visit: Payer: Self-pay | Admitting: Psychiatry

## 2015-08-08 ENCOUNTER — Other Ambulatory Visit: Payer: Self-pay

## 2015-08-08 ENCOUNTER — Ambulatory Visit: Payer: Medicare Other | Admitting: Licensed Clinical Social Worker

## 2015-08-08 NOTE — Telephone Encounter (Signed)
received a fax requesting refill for a 90 day supply for the following medications lamotrigine 25mg , prazosin 2mg , and trazodone 100mg .

## 2015-08-13 ENCOUNTER — Ambulatory Visit (INDEPENDENT_AMBULATORY_CARE_PROVIDER_SITE_OTHER): Payer: Medicare HMO | Admitting: Licensed Clinical Social Worker

## 2015-08-13 DIAGNOSIS — F4310 Post-traumatic stress disorder, unspecified: Secondary | ICD-10-CM | POA: Diagnosis not present

## 2015-08-13 DIAGNOSIS — F411 Generalized anxiety disorder: Secondary | ICD-10-CM

## 2015-08-13 DIAGNOSIS — F3163 Bipolar disorder, current episode mixed, severe, without psychotic features: Secondary | ICD-10-CM | POA: Diagnosis not present
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
## 2015-08-20 ENCOUNTER — Ambulatory Visit (INDEPENDENT_AMBULATORY_CARE_PROVIDER_SITE_OTHER): Payer: Medicare HMO | Admitting: Psychiatry

## 2015-08-20 ENCOUNTER — Encounter: Payer: Self-pay | Admitting: Psychiatry

## 2015-08-20 VITALS — BP 108/76 | HR 86 | Temp 97.7°F | Ht 63.5 in | Wt 153.6 lb

## 2015-08-20 DIAGNOSIS — F5081 Binge eating disorder: Secondary | ICD-10-CM

## 2015-08-20 DIAGNOSIS — F3163 Bipolar disorder, current episode mixed, severe, without psychotic features: Secondary | ICD-10-CM | POA: Diagnosis not present

## 2015-08-20 MED ORDER — TRAZODONE HCL 100 MG PO TABS: 200.0000 mg | ORAL_TABLET | Freq: Every day | ORAL | Status: DC

## 2015-08-20 MED ORDER — TOPIRAMATE 100 MG PO TABS: 100.0000 mg | ORAL_TABLET | Freq: Two times a day (BID) | ORAL | Status: DC

## 2015-08-20 MED ORDER — BUPROPION HCL 100 MG PO TABS: 100.0000 mg | ORAL_TABLET | Freq: Every day | ORAL | Status: DC

## 2015-08-20 MED ORDER — LAMOTRIGINE 25 MG PO TABS: 50.0000 mg | ORAL_TABLET | Freq: Every day | ORAL | Status: DC

## 2015-08-20 MED ORDER — PRAZOSIN HCL 2 MG PO CAPS: 2.0000 mg | ORAL_CAPSULE | Freq: Every day | ORAL | Status: DC

## 2015-08-20 NOTE — Progress Notes (Signed)
Psychiatric MD Progress Note   Patient Identification: Shelley [name] MRN:  [id number] Date of Evaluation:  08/20/2015 Referral Source: PCP.  Chief Complaint:  Follow up Chief Complaint    Follow-up; Medication Refill; Weight Gain     Visit Diagnosis:    ICD-9-CM ICD-10-CM   1. Bipolar disorder, current episode mixed, severe, without psychotic features (HCC) 296.63 F31.63   2. Binge eating disorder 307.50 F50.81    Diagnosis:   Patient Active Problem List   Diagnosis Date Noted  . Bipolar disorder (HCC) [F31.9] 07/11/2015  . PTSD (post-traumatic stress disorder) [F43.10] 07/11/2015  . Generalized anxiety disorder [F41.1] 07/11/2015  . Dyspnea [R06.00] 05/09/2014  . Obesity [E66.9] 05/09/2014   History of Present Illness:  Patient is a 50 year old female who presented for follow  appointment. She reported that she stopped taking the Lexapro as she has been gaining weight. She remains focused on her weight at this time. She has gained 3 pounds since her last appointment. She reported that she ate a pint of ice cream since her last appointment. She has binge eating episodes and then she tries to minimize her binge eating. She is focused on her medication as well as on her weight. Patient reported that she takes Topamax 100 mg twice daily. She is interested in going higher on the dose of lamotrigine at this time. Patient currently denied having any mood swings. She was concerned about her therapist leaving this office and reported that she had good relationship with her. She appeared somewhat apprehensive during this appointment.  She  denied having any suicidal homicidal ideations or plans.   Elements:  Severity:  moderate . Associated Signs/Symptoms: Depression Symptoms:  depressed mood, insomnia, fatigue, feelings of worthlessness/guilt, difficulty concentrating, hopelessness, anxiety, disturbed sleep, decreased appetite, (Hypo) Manic Symptoms:  Irritable Mood, Labiality of  Mood, Anxiety Symptoms:  Excessive Worry, Psychotic Symptoms:  none PTSD Symptoms: Negative NA  Past Medical History:  Past Medical History  Diagnosis Date  . Hypertension   . PTSD (post-traumatic stress disorder)   . Bipolar 1 disorder (HCC)   . Anxiety   . Chronic kidney disease     Past Surgical History  Procedure Laterality Date  . None    . Bladder surgery     Family History:  Family History  Problem Relation Age of Onset  . Cancer Mother   . Alcohol abuse Mother   . Heart attack Father   . Cancer Father   . Depression Sister   . Hypertension Brother   . Diabetes Brother   . Drug abuse Sister   . Alcohol abuse Sister   . Obesity Sister   . Anxiety disorder Sister   . Depression Sister   . Hypertension Brother   . Depression Brother   . Alcohol abuse Brother    Social History:   Social History   Social History  . Marital Status: Divorced    Spouse Name: N/A  . Number of Children: 3  . Years of Education: N/A   Social History Main Topics  . Smoking status: Never Smoker   . Smokeless tobacco: Never Used  . Alcohol Use: No  . Drug Use: No  . Sexual Activity: Not Currently    Birth Control/ Protection: None   Other Topics Concern  . None   Social History Narrative   Additional Social History:  Patient reported that she has relocated from [place] 4 years ago. Her ex-husband lives still there.  Musculoskeletal: Strength & Muscle Tone: within  normal limits Gait & Station: normal Patient leans: N/A  Psychiatric Specialty Exam: HPI  ROS  Blood pressure 108/76, pulse 86, temperature 97.7 F (36.5 C), temperature source Tympanic, height 5' 3.5" (1.613 m), weight 153 lb 9.6 oz (69.673 kg), SpO2 99 %.Body mass index is 26.78 kg/(m^2).  General Appearance: Casual  Eye Contact:  Fair  Speech:  Clear and Coherent  Volume:  Normal  Mood:  Anxious and Dysphoric  Affect:  Congruent and Tearful  Thought Process:  Coherent  Orientation:  Full (Time,  Place, and Person)  Thought Content:  WDL  Suicidal Thoughts:  No  Homicidal Thoughts:  No  Memory:  Immediate;   Fair  Judgement:  Intact  Insight:  Fair  Psychomotor Activity:  Normal  Concentration:  Fair  Recall:  [employer]  Fund of Knowledge:Fair  Language: Fair  Akathisia:  No  Handed:  Right  AIMS (if indicated):    Assets:  Communication Skills Desire for Improvement Physical Health Social Support  ADL's:  Intact  Cognition: WNL  Sleep:  2-3   Is the patient at risk to self?  No. Has the patient been a risk to self in the past 6 months?  No. Has the patient been a risk to self within the distant past?  Yes.   Is the patient a risk to others?  No. Has the patient been a risk to others in the past 6 months?  No. Has the patient been a risk to others within the distant past?  No.  Allergies:  No Known Allergies Current Medications: Current Outpatient Prescriptions  Medication Sig Dispense Refill  . b complex vitamins capsule     . buPROPion (WELLBUTRIN) 100 MG tablet     . escitalopram (LEXAPRO) 10 MG tablet Take 1 tablet (10 mg total) by mouth daily. 30 tablet 0  . lamoTRIgine (LAMICTAL) 25 MG tablet Take 1 tablet (25 mg total) by mouth daily. 30 tablet 0  . lisinopril (PRINIVIL,ZESTRIL) 10 MG tablet Take 10 mg by mouth daily.    . Magnesium 250 MG TABS Take by mouth.    . Multiple Vitamin (MULTI-VITAMINS) TABS Take by mouth.    . naproxen (NAPROSYN) 500 MG tablet Take 500 mg by mouth daily as needed.    . penicillin v potassium (VEETID) 500 MG tablet     . potassium gluconate 595 (99 K) MG TABS tablet Take 595 mg by mouth.    . prazosin (MINIPRESS) 2 MG capsule Take 1 capsule (2 mg total) by mouth at bedtime. 30 capsule 1  . spironolactone (ALDACTONE) 25 MG tablet Take 25 mg by mouth daily.    Marland Kitchen. topiramate (TOPAMAX) 100 MG tablet Take 1 tablet (100 mg total) by mouth 2 (two) times daily. 60 tablet 0  . traZODone (DESYREL) 100 MG tablet Take 2 tablets (200 mg total) by  mouth at bedtime. 60 tablet 0  . tretinoin (RETIN-A) 0.025 % gel     . valACYclovir (VALTREX) 1000 MG tablet Take 1,000 mg by mouth 2 (two) times daily.    . vitamin B-12 (CYANOCOBALAMIN) 100 MCG tablet Take 100 mcg by mouth daily.     No current facility-administered medications for this visit.    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 [place]  She moved to [place] 5 years ago. Divorced x 4 years  Has 3 daughters- all in college. Lives by self , on SSI.  Brother died of alcoholism at 36.    Substance Abuse History in the last 12 months:  no   Consequences of Substance Abuse: Negative NA  Medical Decision Making:  Review of Psycho-Social Stressors (1) and Review and summation of old records (2)  Treatment Plan Summary: Medication management   Discussed with patient about her medications. I will titrate the dose of lamotrigine 50 ng by mouth daily. DC Lexapro She will be given trazodone 200 mg at bedtime for insomnia. She is going to follow with a therapist on a weekly basis. She will continue on prazosin 2 mg  at bedtime Patient is also taking Topamax 100 mg by mouth twice a day She was given 90 day supply of all her medications Follow-up in a month   More than 50% of the time spent in psychoeducation, counseling and coordination of care.    This note was generated in part or whole with voice recognition software. Voice regonition is usually quite accurate but there are transcription errors that can and very often do occur. I apologize for any typographical errors that were not detected and corrected.    Brandy Hale, MD    5/2/201712:12 PM

## 2015-08-22 ENCOUNTER — Ambulatory Visit: Payer: Medicare Other | Admitting: Psychiatry

## 2015-09-03 ENCOUNTER — Ambulatory Visit: Payer: Medicaid Other | Admitting: Licensed Clinical Social Worker

## 2015-09-03 NOTE — Telephone Encounter (Signed)
PT WAS SEEN ON  08-20-15 FOR MEDICATION REFILLS

## 2015-09-17 ENCOUNTER — Encounter: Payer: Self-pay | Admitting: Psychiatry

## 2015-09-17 ENCOUNTER — Ambulatory Visit (INDEPENDENT_AMBULATORY_CARE_PROVIDER_SITE_OTHER): Payer: Medicare HMO | Admitting: Psychiatry

## 2015-09-17 VITALS — BP 110/64 | HR 123 | Temp 97.4°F | Ht 63.5 in | Wt 153.8 lb

## 2015-09-17 DIAGNOSIS — F5081 Binge eating disorder: Secondary | ICD-10-CM

## 2015-09-17 DIAGNOSIS — F3163 Bipolar disorder, current episode mixed, severe, without psychotic features: Secondary | ICD-10-CM

## 2015-09-17 DIAGNOSIS — F4310 Post-traumatic stress disorder, unspecified: Secondary | ICD-10-CM | POA: Diagnosis not present

## 2015-09-17 MED ORDER — ESCITALOPRAM OXALATE 10 MG PO TABS: 10.0000 mg | ORAL_TABLET | Freq: Every day | ORAL | Status: DC

## 2015-09-17 NOTE — Progress Notes (Signed)
Psychiatric MD Progress Note   Patient Identification: Shelley [name] MRN:  [id number] Date of Evaluation:  09/17/2015 Referral Source: PCP.  Chief Complaint:  Follow up Chief Complaint    Follow-up; Medication Refill     Visit Diagnosis:    ICD-9-CM ICD-10-CM   1. Bipolar disorder, current episode mixed, severe, without psychotic features (HCC) 296.63 F31.63   2. Binge eating disorder 307.50 F50.81   3. PTSD (post-traumatic stress disorder) 309.81 F43.10    Diagnosis:   Patient Active Problem List   Diagnosis Date Noted  . Bipolar disorder (HCC) [F31.9] 07/11/2015  . PTSD (post-traumatic stress disorder) [F43.10] 07/11/2015  . Generalized anxiety disorder [F41.1] 07/11/2015  . Dyspnea [R06.00] 05/09/2014  . Obesity [E66.9] 05/09/2014   History of Present Illness:  Patient is a 50 year old female who presented for follow up appointment. She reported that she Is not doing well and she has not lost any weight since she stopped the Lexapro. She reported that she has becoming more weepy and was crying over the weekend. Patient reported that she continues to have racing thoughts and was noted to be hyper during the interview. Patient is taking Wellbutrin 100 mg in the morning. Patient reported that she is trying to lose weight and is focused on the same. She has joined the goal gym with her friend and is planning to swim with her on a regular basis. Patient reported that she has been compliant with her medications but her nightmares continue to be the same  We discussed about her medications in detail and she agreed with the plan to go higher on the dose of lamotrigine. We also discussed about titrating the dose of prazosin at this time. Patient currently denied having any suicidal homicidal ideations or plans. She denied having any perceptual disturbances.  She     Elements:  Severity:  moderate . Associated Signs/Symptoms: Depression Symptoms:  depressed  mood, insomnia, fatigue, difficulty concentrating, anxiety, disturbed sleep, decreased appetite, (Hypo) Manic Symptoms:  Irritable Mood, Labiality of Mood, Anxiety Symptoms:  Excessive Worry, Psychotic Symptoms:  none PTSD Symptoms: Negative NA  Past Medical History:  Past Medical History  Diagnosis Date  . Hypertension   . PTSD (post-traumatic stress disorder)   . Bipolar 1 disorder (HCC)   . Anxiety   . Chronic kidney disease     Past Surgical History  Procedure Laterality Date  . None    . Bladder surgery     Family History:  Family History  Problem Relation Age of Onset  . Cancer Mother   . Alcohol abuse Mother   . Heart attack Father   . Cancer Father   . Depression Sister   . Hypertension Brother   . Diabetes Brother   . Drug abuse Sister   . Alcohol abuse Sister   . Obesity Sister   . Anxiety disorder Sister   . Depression Sister   . Hypertension Brother   . Depression Brother   . Alcohol abuse Brother    Social History:   Social History   Social History  . Marital Status: Divorced    Spouse Name: N/A  . Number of Children: 3  . Years of Education: N/A   Social History Main Topics  . Smoking status: Never Smoker   . Smokeless tobacco: Never Used  . Alcohol Use: No  . Drug Use: No  . Sexual Activity: Not Currently    Birth Control/ Protection: None   Other Topics Concern  . None  Social History Narrative   Additional Social History:  Patient reported that she has relocated from [place] 4 years ago. Her ex-husband lives still there.  Musculoskeletal: Strength & Muscle Tone: within normal limits Gait & Station: normal Patient leans: N/A  Psychiatric Specialty Exam: HPI  ROS  Blood pressure 110/64, pulse 123, temperature 97.4 F (36.3 C), temperature source Tympanic, height 5' 3.5" (1.613 m), weight 153 lb 12.8 oz (69.763 kg), SpO2 94 %.Body mass index is 26.81 kg/(m^2).  General Appearance: Casual  Eye Contact:  Fair  Speech:   Clear and Coherent  Volume:  Normal  Mood:  Anxious  Affect:  Congruent  Thought Process:  Coherent  Orientation:  Full (Time, Place, and Person)  Thought Content:  WDL  Suicidal Thoughts:  No  Homicidal Thoughts:  No  Memory:  Immediate;   Fair  Judgement:  Intact  Insight:  Fair  Psychomotor Activity:  Normal  Concentration:  Fair  Recall:  [employer]  Fund of Knowledge:Fair  Language: Fair  Akathisia:  No  Handed:  Right  AIMS (if indicated):    Assets:  Communication Skills Desire for Improvement Physical Health Social Support  ADL's:  Intact  Cognition: WNL  Sleep:  2-3   Is the patient at risk to self?  No. Has the patient been a risk to self in the past 6 months?  No. Has the patient been a risk to self within the distant past?  Yes.   Is the patient a risk to others?  No. Has the patient been a risk to others in the past 6 months?  No. Has the patient been a risk to others within the distant past?  No.  Allergies:  No Known Allergies Current Medications: Current Outpatient Prescriptions  Medication Sig Dispense Refill  . b complex vitamins capsule     . buPROPion (WELLBUTRIN) 100 MG tablet Take 1 tablet (100 mg total) by mouth daily after breakfast. 90 tablet 0  . lamoTRIgine (LAMICTAL) 25 MG tablet Take 2 tablets (50 mg total) by mouth daily. 180 tablet 0  . lisinopril (PRINIVIL,ZESTRIL) 10 MG tablet Take 10 mg by mouth daily.    . Magnesium 250 MG TABS Take by mouth.    . Multiple Vitamin (MULTI-VITAMINS) TABS Take by mouth.    . naproxen (NAPROSYN) 500 MG tablet Take 500 mg by mouth daily as needed.    . penicillin v potassium (VEETID) 500 MG tablet     . prazosin (MINIPRESS) 2 MG capsule Take 1 capsule (2 mg total) by mouth at bedtime. 90 capsule 1  . spironolactone (ALDACTONE) 25 MG tablet Take 25 mg by mouth daily.    Marland Kitchen. topiramate (TOPAMAX) 100 MG tablet Take 1 tablet (100 mg total) by mouth 2 (two) times daily. 180 tablet 0  . traZODone (DESYREL) 100 MG  tablet Take 2 tablets (200 mg total) by mouth at bedtime. 180 tablet 0  . tretinoin (RETIN-A) 0.025 % gel     . valACYclovir (VALTREX) 1000 MG tablet Take 1,000 mg by mouth 2 (two) times daily.     No current facility-administered medications for this visit.    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 [place]  She moved to [place] 5 years ago. Divorced x 4 years  Has 3 daughters- all in college. Lives by self , on SSI.   Brother died of alcoholism at 9051.    Substance Abuse  History in the last 12 months:  no   Consequences of Substance Abuse: Negative NA  Medical Decision Making:  Review of Psycho-Social Stressors (1) and Review and summation of old records (2)  Treatment Plan Summary: Medication management   Discussed with patient about her medications. I will titrate the dose of lamotrigine 100 ng by mouth daily. Start Lexapro 5 gm qdaily x 2 week then 10 daily  D/c wellbutrin She will be given trazodone 200 mg at bedtime for insomnia. She is going to follow with a therapist on a weekly basis. She will continue on prazosin 4 mg  at bedtime Patient is also taking Topamax 100 mg by mouth twice a day She was given 90 day supply of all her medications Follow-up in a month   More than 50% of the time spent in psychoeducation, counseling and coordination of care.    This note was generated in part or whole with voice recognition software. Voice regonition is usually quite accurate but there are transcription errors that can and very often do occur. I apologize for any typographical errors that were not detected and corrected.    Brandy Hale, MD    5/30/201711:06 AM

## 2015-10-08 ENCOUNTER — Ambulatory Visit (INDEPENDENT_AMBULATORY_CARE_PROVIDER_SITE_OTHER): Payer: Medicare HMO | Admitting: Psychiatry

## 2015-10-08 ENCOUNTER — Encounter: Payer: Self-pay | Admitting: Psychiatry

## 2015-10-08 VITALS — BP 122/60 | HR 105 | Temp 97.4°F | Ht 63.5 in | Wt 152.2 lb

## 2015-10-08 DIAGNOSIS — F4312 Post-traumatic stress disorder, chronic: Secondary | ICD-10-CM

## 2015-10-08 DIAGNOSIS — F411 Generalized anxiety disorder: Secondary | ICD-10-CM | POA: Diagnosis not present

## 2015-10-08 DIAGNOSIS — F3163 Bipolar disorder, current episode mixed, severe, without psychotic features: Secondary | ICD-10-CM | POA: Diagnosis not present

## 2015-10-08 MED ORDER — ESCITALOPRAM OXALATE 10 MG PO TABS: 10.0000 mg | ORAL_TABLET | Freq: Every day | ORAL | Status: DC
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
## 2015-10-30 ENCOUNTER — Ambulatory Visit: Payer: Medicare Other | Admitting: Psychiatry

## 2015-11-18 ENCOUNTER — Encounter: Payer: Self-pay | Admitting: Psychiatry

## 2015-11-18 ENCOUNTER — Ambulatory Visit (INDEPENDENT_AMBULATORY_CARE_PROVIDER_SITE_OTHER): Payer: Medicare HMO | Admitting: Psychiatry

## 2015-11-18 VITALS — BP 94/58 | HR 116 | Temp 98.6°F | Ht 63.5 in | Wt 153.4 lb

## 2015-11-18 DIAGNOSIS — F4312 Post-traumatic stress disorder, chronic: Secondary | ICD-10-CM | POA: Diagnosis not present

## 2015-11-18 DIAGNOSIS — F3163 Bipolar disorder, current episode mixed, severe, without psychotic features: Secondary | ICD-10-CM

## 2015-11-18 MED ORDER — TOPIRAMATE 100 MG PO TABS: 100.0000 mg | ORAL_TABLET | Freq: Two times a day (BID) | ORAL | 0 refills | Status: DC

## 2015-11-18 MED ORDER — TRAZODONE HCL 100 MG PO TABS: 200.0000 mg | ORAL_TABLET | Freq: Every day | ORAL | 0 refills | Status: DC

## 2015-11-18 MED ORDER — LAMOTRIGINE 100 MG PO TABS: 100.0000 mg | ORAL_TABLET | Freq: Every day | ORAL | 0 refills | Status: DC

## 2015-11-18 MED ORDER — ESCITALOPRAM OXALATE 10 MG PO TABS: 10.0000 mg | ORAL_TABLET | Freq: Every day | ORAL | 0 refills | Status: DC

## 2015-11-18 MED ORDER — PRAZOSIN HCL 2 MG PO CAPS: 4.0000 mg | ORAL_CAPSULE | Freq: Every day | ORAL | 0 refills | Status: DC

## 2015-11-18 NOTE — Progress Notes (Signed)
Psychiatric MD Progress Note   Patient Identification: Shelley [name] MRN:  [id number] Date of Evaluation:  11/18/2015 Referral Source: PCP.  Chief Complaint:  Follow up Chief Complaint    Follow-up; Medication Refill     Visit Diagnosis:    ICD-9-CM ICD-10-CM   1. Bipolar disorder, current episode mixed, severe, without psychotic features (HCC) 296.63 F31.63   2. Chronic post-traumatic stress disorder (PTSD) 309.81 F43.12    Diagnosis:   Patient Active Problem List   Diagnosis Date Noted  . Bipolar disorder (HCC) [F31.9] 07/11/2015  . PTSD (post-traumatic stress disorder) [F43.10] 07/11/2015  . Generalized anxiety disorder [F41.1] 07/11/2015  . Dyspnea [R06.00] 05/09/2014  . Obesity [E66.9] 05/09/2014   History of Present Illness:  Patient is a 50 year old female who presented for follow up appointment. She reported that she Is doing well on her current medications. She reported that she does not have any more symptoms. She reported that she is having stress related to the neighbor who might be following her. [place] Patient reported that she has already talked to her preacher and her pastor and they are supportive. She reported that she is looking for different placement in [place]. She wants to move with her cats and wants to have the form filled out for the same. She has titrated the dose of her medications and reported that they are helpful. She is compliant with the medications at this time. She currently denied having any suicidal homicidal ideations or plans.  She denied having any adverse reactions to the medications. She gets all her medications to the mail order pharmacy.       Elements:  Severity:  moderate . Associated Signs/Symptoms: Depression Symptoms:  depressed mood, insomnia, fatigue, difficulty concentrating, anxiety, disturbed sleep, decreased appetite, (Hypo) Manic Symptoms:  Irritable Mood, Labiality  of Mood, Anxiety Symptoms:  Excessive Worry, Psychotic Symptoms:  none PTSD Symptoms: Negative NA  Past Medical History:  Past Medical History:  Diagnosis Date  . Anxiety   . Bipolar 1 disorder (HCC)   . Chronic kidney disease   . Hypertension   . PTSD (post-traumatic stress disorder)     Past Surgical History:  Procedure Laterality Date  . BLADDER SURGERY    . none     Family History:  Family History  Problem Relation Age of Onset  . Cancer Mother   . Alcohol abuse Mother   . Heart attack Father   . Cancer Father   . Depression Sister   . Hypertension Brother   . Diabetes Brother   . Drug abuse Sister   . Alcohol abuse Sister   . Obesity Sister   . Anxiety disorder Sister   . Depression Sister   . Hypertension Brother   . Depression Brother   . Alcohol abuse Brother    Social History:   Social History   Social History  . Marital status: Divorced    Spouse name: N/A  . Number of children: 3  . Years of education: N/A   Social History Main Topics  . Smoking status: Never Smoker  . Smokeless tobacco: Never Used  . Alcohol use No  . Drug use: No  . Sexual activity: Not Currently    Birth control/ protection: None   Other Topics Concern  . None   Social History Narrative  . None   Additional Social History:  Patient reported that she has relocated from [place] 4 years ago. Her ex-husband  lives still there.  Musculoskeletal: Strength & Muscle Tone: within normal limits Gait & Station: normal Patient leans: N/A  Psychiatric Specialty Exam: HPI  ROS  Blood pressure (!) 94/58, pulse (!) 116, temperature 98.6 F (37 C), temperature source Oral, height 5' 3.5" (1.613 m), weight 153 lb 6.4 oz (69.6 kg).Body mass index is 26.75 kg/m.  General Appearance: Casual  Eye Contact:  Fair  Speech:  Clear and Coherent  Volume:  Normal  Mood:  Anxious  Affect:  Congruent  Thought Process:  Coherent  Orientation:  Full (Time, Place, and Person)  Thought  Content:  WDL  Suicidal Thoughts:  No  Homicidal Thoughts:  No  Memory:  Immediate;   Fair  Judgement:  Intact  Insight:  Fair  Psychomotor Activity:  Normal  Concentration:  Fair  Recall:  [employer] of Knowledge:Fair  Language: Fair  Akathisia:  No  Handed:  Right  AIMS (if indicated):    Assets:  Communication Skills Desire for Improvement Physical Health Social Support  ADL's:  Intact  Cognition: WNL  Sleep:  2-3   Is the patient at risk to self?  No. Has the patient been a risk to self in the past 6 months?  No. Has the patient been a risk to self within the distant past?  Yes.   Is the patient a risk to others?  No. Has the patient been a risk to others in the past 6 months?  No. Has the patient been a risk to others within the distant past?  No.  Allergies:  No Known Allergies Current Medications: Current Outpatient Prescriptions  Medication Sig Dispense Refill  . b complex vitamins capsule     . escitalopram (LEXAPRO) 10 MG tablet Take 1 tablet (10 mg total) by mouth daily. 30 tablet 0  . lamoTRIgine (LAMICTAL) 25 MG tablet Take 2 tablets (50 mg total) by mouth daily. 180 tablet 0  . lisinopril (PRINIVIL,ZESTRIL) 10 MG tablet Take 10 mg by mouth daily.    . Magnesium 250 MG TABS Take by mouth.    . Multiple Vitamin (MULTI-VITAMINS) TABS Take by mouth.    . naproxen (NAPROSYN) 500 MG tablet Take 500 mg by mouth daily as needed.    . penicillin v potassium (VEETID) 500 MG tablet     . prazosin (MINIPRESS) 2 MG capsule Take 1 capsule (2 mg total) by mouth at bedtime. 90 capsule 1  . spironolactone (ALDACTONE) 25 MG tablet Take 25 mg by mouth daily.    Marland Kitchen topiramate (TOPAMAX) 100 MG tablet Take 1 tablet (100 mg total) by mouth 2 (two) times daily. 180 tablet 0  . traZODone (DESYREL) 100 MG tablet Take 2 tablets (200 mg total) by mouth at bedtime. 180 tablet 0  . tretinoin (RETIN-A) 0.025 % gel     . valACYclovir (VALTREX) 1000 MG tablet Take 1,000 mg by mouth 2 (two)  times daily.     No current facility-administered medications for this visit.     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 [place]  She moved to [place] 5 years ago. Divorced x 4 years  Has 3 daughters- all in college. Lives by self , on SSI.   Brother died of alcoholism at 10.    Substance Abuse History in the last 12 months:  no   Consequences of Substance Abuse: Negative NA  Medical Decision Making:  Review of Psycho-Social  Stressors (1) and Review and summation of old records (2)  Treatment Plan Summary: Medication management   Discussed with patient about her medications. Continue lamotrigine 100 ng by mouth daily. Continue Lexapro 10 mg  daily  She will be given trazodone 200 mg at bedtime for insomnia.  Patient is also taking Topamax 100 mg by mouth twice a day Prazosin 4 mg at bedtime. She was given 90 day supply of all her medications Follow-up in 4 weeks.   Advised patient to call or come for an early appointment if she notices worsening of her symptoms. Also advised her to go to the ER and she demonstrated understanding.   More than 50% of the time spent in psychoeducation, counseling and coordination of care.    This note was generated in part or whole with voice recognition software. Voice regonition is usually quite accurate but there are transcription errors that can and very often do occur. I apologize for any typographical errors that were not detected and corrected.    Brandy Hale, MD    7/31/201710:56 AM

## 2015-11-21 ENCOUNTER — Ambulatory Visit (INDEPENDENT_AMBULATORY_CARE_PROVIDER_SITE_OTHER): Payer: Medicare HMO | Admitting: Licensed Clinical Social Worker

## 2015-11-21 DIAGNOSIS — F3160 Bipolar disorder, current episode mixed, unspecified: Secondary | ICD-10-CM | POA: Diagnosis not present

## 2015-11-21 DIAGNOSIS — F4312 Post-traumatic stress disorder, chronic: Secondary | ICD-10-CM

## 2015-11-21 NOTE — Progress Notes (Signed)
   THERAPIST PROGRESS NOTE  Session Time: 11:00 AM-11:55 AM  Participation Level: Active  Behavioral Response: CasualAlertDepressed, Euthymic and tearful  Type of Therapy: Individual Therapy  Treatment Goals addressed:  Anxiety, Coping and Diagnosis: PTSD, Bipolar Disorder most revent episode depressed  Interventions: DBT, Solution Focused, Supportive and Family Systems  Summary: Shelley [name] is [name] 50 y.o. female who presents with giving therapist update saying that she got [name] job called at [name]. She enjoys gardening and it would have been [name] perfect job for her if they had been honest. She is no longer working there but related that she talked to [employer] and said it was [name] blessing. She described that [name] month before the last one she wanted to take all her pills. She said what she was able to get out of the moment by talking to God but they were in argument. She continues to work on building her relationship with God. She recognizes [name] lot of stresses at the time. Updated therapist that boyfriend's hearing got postponed again until November. She is going to wash hands of daughters for now because she keeps reaching out and they are not responding. She is tired of being treated badly by them. She had [name] conflict with her sister as well. The girls are not speaking with her as well. Patient has decided that she is going to give her sister space as well. Shared positive news that she is planning on moving and that she has [name] car.    Suicidal/Homicidal: No  Therapist Response: Worked with patient on processing her emotions related to her stressors. Worked with patient on gaining insight to engaging in behaviors that enable healthier interaction patterns within the family and also to help her to set healthy boundaries for herself. Helped patient get context of daughter's behaviors by reminding her that they are young adults and still developing as adults. Discussed how tension related to  sister may be related to concern for her daughter's and giving each other space may be [name] positive step.  Provided positive feedback for positive steps to has taken to manage stressors and healthy coping strategies she is using including her spirituality and her supports. Worked on building insight of how self-destructive actions only cause problems to get worse and increase emotional suffering.   Plan: Return again in [place].2.Patient implement effective interpersonal skills and healthy coping strategies to stressors.   Diagnosis: Axis I: Bipolar, current episode mixed, unspecified severity, chronic PTSD     Axis II: No diagnosis    Bowman,Shelley A, LCSW 11/21/2015

## 2015-11-27 ENCOUNTER — Ambulatory Visit: Payer: Self-pay | Admitting: Licensed Clinical Social Worker

## 2015-12-04 ENCOUNTER — Telehealth: Payer: Self-pay | Admitting: Psychiatry

## 2015-12-04 ENCOUNTER — Ambulatory Visit (INDEPENDENT_AMBULATORY_CARE_PROVIDER_SITE_OTHER): Payer: Medicare HMO | Admitting: Licensed Clinical Social Worker

## 2015-12-04 DIAGNOSIS — F3160 Bipolar disorder, current episode mixed, unspecified: Secondary | ICD-10-CM | POA: Diagnosis not present

## 2015-12-04 DIAGNOSIS — F4312 Post-traumatic stress disorder, chronic: Secondary | ICD-10-CM | POA: Diagnosis not present
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
## 2015-12-05 NOTE — Telephone Encounter (Signed)
She need to communicate with her manager at property about lease.

## 2015-12-06 ENCOUNTER — Telehealth: Payer: Self-pay | Admitting: Licensed Clinical Social Worker

## 2015-12-06 NOTE — Telephone Encounter (Signed)
Left message for patient to call therapist or that she would see her at next therapy appointment

## 2015-12-11 ENCOUNTER — Ambulatory Visit: Payer: Medicare Other | Admitting: Licensed Clinical Social Worker

## 2015-12-18 ENCOUNTER — Ambulatory Visit: Payer: Medicare Other | Admitting: Licensed Clinical Social Worker

## 2015-12-19 ENCOUNTER — Ambulatory Visit: Payer: Medicare Other | Admitting: Psychiatry

## 2016-01-01 ENCOUNTER — Ambulatory Visit (INDEPENDENT_AMBULATORY_CARE_PROVIDER_SITE_OTHER): Payer: Medicare HMO | Admitting: Licensed Clinical Social Worker

## 2016-01-01 DIAGNOSIS — F3160 Bipolar disorder, current episode mixed, unspecified: Secondary | ICD-10-CM

## 2016-01-01 DIAGNOSIS — F4312 Post-traumatic stress disorder, chronic: Secondary | ICD-10-CM | POA: Diagnosis not present
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
## 2016-01-08 ENCOUNTER — Encounter: Payer: Self-pay | Admitting: Psychiatry

## 2016-01-08 ENCOUNTER — Ambulatory Visit (INDEPENDENT_AMBULATORY_CARE_PROVIDER_SITE_OTHER): Payer: Medicare HMO | Admitting: Psychiatry

## 2016-01-08 ENCOUNTER — Ambulatory Visit (INDEPENDENT_AMBULATORY_CARE_PROVIDER_SITE_OTHER): Payer: Medicare HMO | Admitting: Licensed Clinical Social Worker

## 2016-01-08 VITALS — BP 111/70 | HR 80 | Temp 98.4°F | Ht 63.5 in | Wt 153.6 lb

## 2016-01-08 DIAGNOSIS — F4310 Post-traumatic stress disorder, unspecified: Secondary | ICD-10-CM | POA: Diagnosis not present

## 2016-01-08 DIAGNOSIS — F410 Panic disorder [episodic paroxysmal anxiety] without agoraphobia: Secondary | ICD-10-CM

## 2016-01-08 DIAGNOSIS — F3163 Bipolar disorder, current episode mixed, severe, without psychotic features: Secondary | ICD-10-CM | POA: Diagnosis not present

## 2016-01-08 DIAGNOSIS — F3160 Bipolar disorder, current episode mixed, unspecified: Secondary | ICD-10-CM

## 2016-01-08 DIAGNOSIS — F5081 Binge eating disorder: Secondary | ICD-10-CM | POA: Diagnosis not present

## 2016-01-08 MED ORDER — ESCITALOPRAM OXALATE 10 MG PO TABS: 10.0000 mg | ORAL_TABLET | Freq: Every day | ORAL | 0 refills | Status: DC

## 2016-01-08 MED ORDER — TRAZODONE HCL 100 MG PO TABS: 200.0000 mg | ORAL_TABLET | Freq: Every day | ORAL | 0 refills | Status: DC

## 2016-01-08 MED ORDER — ESCITALOPRAM OXALATE 10 MG PO TABS: 10.0000 mg | ORAL_TABLET | Freq: Every day | ORAL | 1 refills | Status: DC

## 2016-01-08 MED ORDER — PRAZOSIN HCL 2 MG PO CAPS: 4.0000 mg | ORAL_CAPSULE | Freq: Every day | ORAL | 1 refills | Status: DC

## 2016-01-08 MED ORDER — LAMOTRIGINE 100 MG PO TABS: 100.0000 mg | ORAL_TABLET | Freq: Every day | ORAL | 0 refills | Status: DC

## 2016-01-08 MED ORDER — TOPIRAMATE 100 MG PO TABS: 100.0000 mg | ORAL_TABLET | Freq: Two times a day (BID) | ORAL | 0 refills | Status: DC

## 2016-01-08 MED ORDER — LAMOTRIGINE 100 MG PO TABS: 100.0000 mg | ORAL_TABLET | Freq: Every day | ORAL | 1 refills | Status: DC
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
## 2016-01-22 ENCOUNTER — Ambulatory Visit: Payer: Medicare Other | Admitting: Licensed Clinical Social Worker

## 2016-01-22 ENCOUNTER — Other Ambulatory Visit: Payer: Self-pay

## 2016-01-22 NOTE — Telephone Encounter (Signed)
pt called states that she needs a 90 day supply of all her medication [facility].

## 2016-01-29 ENCOUNTER — Ambulatory Visit (INDEPENDENT_AMBULATORY_CARE_PROVIDER_SITE_OTHER): Payer: Medicare HMO | Admitting: Licensed Clinical Social Worker

## 2016-01-29 DIAGNOSIS — F4310 Post-traumatic stress disorder, unspecified: Secondary | ICD-10-CM | POA: Diagnosis not present

## 2016-01-29 DIAGNOSIS — F3160 Bipolar disorder, current episode mixed, unspecified: Secondary | ICD-10-CM | POA: Diagnosis not present

## 2016-01-29 NOTE — Progress Notes (Signed)
   THERAPIST PROGRESS NOTE  Session Time: 11:10 AM to 12 PM  Participation Level: Active  Behavioral Response: CasualAlertEuthymic  Type of Therapy: Individual Therapy  Treatment Goals addressed: Anxiety, Coping and Diagnosis: PTSD, Bipolar Disorder most revent episode depressed  Interventions: CBT, Solution Focused, Strength-based, Supportive and Other: Skills to work on self-esteem  Summary: Shelley [name] is a 50 y.o. female who presents with just moving into house and not feeling so alone. She showed therapist pictures of the house and she feels really good about it. Identified it as as a nice refuge for her. Described the difficulty of letting go of her marriage bed and wedding dress but at the same time move forward with doing this. She explained this is why she thinks she didn't come last week for session. Reviewed her emotions around this and therapist questioned whether she has regrets about past decisions of previous relationships and she related that she didn't know if she deserved better. Explained she that two adolescences from church family have opened up to her and she feels good that they trusted her . Problem solved with therapist related to helping them and discuss motivational interviewing to help them get help. She realizes she can be a good role model in her life.    Suicidal/Homicidal: No  Therapist Response: Validated patient and normalized her feelings of letting go of the past. Discussed the process as both painful but also helps her to accept change and help her to move forward in her life. Discussed core issue the patient devaluing of herself and needs to work on better self-concept that include excepting flaws and building self-esteem. Use therapeutic relationship to support and validate patient to work on these issues. Pointed out that her ability to work with adolescence will help her in process of working on self-esteem. Discussed problem solving around their  issues and processing to help patient be supportive and helpful of them.   Plan: Return again in 1 week.2. Patient continue to gain insight to challenge core beliefs and implement skills to work on self-esteem  Diagnosis: Axis I: Bipolar, current episode mixed, unspecified severity, chronic PTSD    Axis II: No diagnosis    Bowman,[name] A, LCSW 01/29/2016

## 2016-02-05 ENCOUNTER — Ambulatory Visit: Payer: Self-pay | Admitting: Licensed Clinical Social Worker

## 2016-02-12 ENCOUNTER — Ambulatory Visit: Payer: Self-pay | Admitting: Licensed Clinical Social Worker

## 2016-03-26 NOTE — Telephone Encounter (Signed)
Pt will need to keep appt 

## 2016-04-08 ENCOUNTER — Encounter: Payer: Self-pay | Admitting: Psychiatry

## 2016-04-08 ENCOUNTER — Ambulatory Visit (INDEPENDENT_AMBULATORY_CARE_PROVIDER_SITE_OTHER): Payer: Medicare HMO | Admitting: Psychiatry

## 2016-04-08 VITALS — BP 112/77 | HR 126 | Temp 98.6°F | Wt 148.8 lb

## 2016-04-08 DIAGNOSIS — F410 Panic disorder [episodic paroxysmal anxiety] without agoraphobia: Secondary | ICD-10-CM

## 2016-04-08 DIAGNOSIS — F3160 Bipolar disorder, current episode mixed, unspecified: Secondary | ICD-10-CM

## 2016-04-08 MED ORDER — PRAZOSIN HCL 2 MG PO CAPS: 2.0000 mg | ORAL_CAPSULE | Freq: Every day | ORAL | 1 refills | Status: DC

## 2016-04-08 MED ORDER — TOPIRAMATE 100 MG PO TABS: 100.0000 mg | ORAL_TABLET | Freq: Two times a day (BID) | ORAL | 0 refills | Status: DC

## 2016-04-08 MED ORDER — ESCITALOPRAM OXALATE 10 MG PO TABS: 10.0000 mg | ORAL_TABLET | Freq: Every day | ORAL | 1 refills | Status: DC

## 2016-04-08 MED ORDER — LAMOTRIGINE 100 MG PO TABS: 100.0000 mg | ORAL_TABLET | Freq: Every day | ORAL | 0 refills | Status: DC

## 2016-04-08 MED ORDER — TRAZODONE HCL 100 MG PO TABS: 200.0000 mg | ORAL_TABLET | Freq: Every day | ORAL | 0 refills | Status: DC

## 2016-04-08 NOTE — Progress Notes (Signed)
Psychiatric MD Progress Note   Patient Identification: Shelley [name] MRN:  [id number] Date of Evaluation:  04/08/2016 Referral Source: PCP.  Chief Complaint:  Follow up Chief Complaint    Follow-up; Medication Refill     Visit Diagnosis:    ICD-9-CM ICD-10-CM   1. Bipolar affective disorder, current episode mixed, current episode severity unspecified (HCC) 296.60 F31.60    Diagnosis:   Patient Active Problem List   Diagnosis Date Noted  . Bipolar disorder (HCC) [F31.9] 07/11/2015  . PTSD (post-traumatic stress disorder) [F43.10] 07/11/2015  . Generalized anxiety disorder [F41.1] 07/11/2015  . Dyspnea [R06.00] 05/09/2014  . Obesity [E66.9] 05/09/2014   History of Present Illness:  Patient is a 50 year old female who presented for follow up appointment. She reported that she is sad and tearful as her daughters are not coming for the holidays. She reported that 2 of them are staying in [place] and 1 in [place]. She is planning to spend the holidays with her friends. Patient reported that she will not be able to drive to [place] is her weekly is not reliable. She is planning to spend the time with her friends. She appeared calm and alert during the interview. She reported that she is compliant with her medications and has lost some weight. She is excited about losing weight as she has been compliant with her medications. She denied having any side effects of medications. She sleeps well at night. She stated that she missed some appointments with Corrie Dandy  and has to find a new therapist now. She reported that she is anxious about looking for a new therapist as she has a good relationship with Corrie Dandy in the past.  Patient appeared calm and alert during the interview. She denied having any perceptual disturbances. She denied having any suicidal homicidal ideations or plans.   She gets all her medications to the mail order pharmacy.   Elements:  Severity:  moderate . Associated  Signs/Symptoms: Depression Symptoms:  depressed mood, anxiety, disturbed sleep, decreased appetite, (Hypo) Manic Symptoms:  Irritable Mood, Labiality of Mood, Anxiety Symptoms:  Excessive Worry, Psychotic Symptoms:  none PTSD Symptoms: Negative NA  Past Medical History:  Past Medical History:  Diagnosis Date  . Anxiety   . Bipolar 1 disorder (HCC)   . Chronic kidney disease   . Hypertension   . PTSD (post-traumatic stress disorder)     Past Surgical History:  Procedure Laterality Date  . BLADDER SURGERY    . none     Family History:     Social History:   Social History   Social History  . Marital status: Divorced    Spouse name: N/A  . Number of children: 3  . Years of education: N/A   Social History Main Topics  . Smoking status: Never Smoker  . Smokeless tobacco: Never Used  . Alcohol use No  . Drug use: No  . Sexual activity: Not Currently    Birth control/ protection: None   Other Topics Concern  . None   Social History Narrative  . None   Psychiatric Specialty Exam: HPI  ROS  Blood pressure 112/77, pulse (!) 126, temperature 98.6 F (37 C), temperature source Oral, weight 148 lb 12.8 oz (67.5 kg).Body mass index is 25.95 kg/m.  General Appearance: Casual  Eye Contact:  Fair  Speech:  Clear and Coherent  Volume:  Normal  Mood:  Anxious  Affect:  Congruent  Thought Process:  Coherent  Orientation:  Full (Time, Place, and Person)  Thought Content:  WDL  Suicidal Thoughts:  No  Homicidal Thoughts:  No  Memory:  Immediate;   Fair  Judgement:  Intact  Insight:  Fair  Psychomotor Activity:  Normal  Concentration:  Fair  Recall:  [employer]  Fund of Knowledge:Fair  Language: Fair  Akathisia:  No  Handed:  Right  AIMS (if indicated):    Assets:  Communication Skills Desire for Improvement Physical Health Social Support  ADL's:  Intact  Cognition: WNL  Sleep:  2-3   Current Medications: Current Outpatient Prescriptions  Medication Sig  Dispense Refill  . b complex vitamins capsule     . escitalopram (LEXAPRO) 10 MG tablet Take 1 tablet (10 mg total) by mouth daily. 90 tablet 1  . lamoTRIgine (LAMICTAL) 100 MG tablet Take 1 tablet (100 mg total) by mouth daily. 90 tablet 0  . lisinopril (PRINIVIL,ZESTRIL) 10 MG tablet Take 10 mg by mouth daily.    . Magnesium 250 MG TABS Take by mouth.    . Multiple Vitamin (MULTI-VITAMINS) TABS Take by mouth.    . naproxen (NAPROSYN) 500 MG tablet Take 500 mg by mouth daily as needed.    . penicillin v potassium (VEETID) 500 MG tablet     . prazosin (MINIPRESS) 2 MG capsule Take 2 capsules (4 mg total) by mouth at bedtime. 180 capsule 1  . spironolactone (ALDACTONE) 25 MG tablet Take 25 mg by mouth daily.    Marland Kitchen. topiramate (TOPAMAX) 100 MG tablet Take 1 tablet (100 mg total) by mouth 2 (two) times daily. 180 tablet 0  . traZODone (DESYREL) 100 MG tablet Take 2 tablets (200 mg total) by mouth at bedtime. 180 tablet 0  . tretinoin (RETIN-A) 0.025 % gel     . valACYclovir (VALTREX) 1000 MG tablet Take 1,000 mg by mouth 2 (two) times daily.     No current facility-administered medications for this visit.     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 [place]  She moved to  5 years ago. Divorced x 4 years  Has 3 daughters- all in college. Lives by self , on SSI.   Brother died of alcoholism at 4551.    Substance Abuse History in the last 12 months:  no   Consequences of Substance Abuse: Negative NA     Psychiatric Specialty Exam: Medication Refill     ROS  Blood pressure 112/77, pulse (!) 126, temperature 98.6 F (37 C), temperature source Oral, weight 148 lb 12.8 oz (67.5 kg).Body mass index is 25.95 kg/m.  General Appearance: Casual  Eye Contact:  Fair  Speech:  Clear and Coherent  Volume:  Normal  Mood:  Anxious  Affect:  Congruent  Thought Process:  Coherent  Orientation:   Full (Time, Place, and Person)  Thought Content:  WDL  Suicidal Thoughts:  No  Homicidal Thoughts:  No  Memory:  Immediate;   Fair  Judgement:  Intact  Insight:  Fair  Psychomotor Activity:  Normal  Concentration:  Fair  Recall:  [employer]  Fund of Knowledge:Fair  Language: Fair  Akathisia:  No  Handed:  Right  AIMS (if indicated):    Assets:  Communication Skills Desire for Improvement Physical Health Social Support  ADL's:  Intact  Cognition: WNL  Sleep:  2-3    Medical Decision Making:  Review of Psycho-Social Stressors (1) and Review and summation of old records (2)  Treatment Plan Summary:  Medication management   Discussed with patient about her medications. Continue lamotrigine 100 ng by mouth daily. Continue Lexapro 10 mg  daily  She will be given trazodone 200 mg at bedtime for insomnia.  Patient is also taking Topamax 100 mg by mouth twice a day Prazosin 2 mg at bedtime. She was given 90 day supply of all her medications Follow-up in 3 months      More than 50% of the time spent in psychoeducation, counseling and coordination of care.    Brandy HaleUzma Taniya Dasher, MD

## 2016-05-11 ENCOUNTER — Other Ambulatory Visit: Payer: Self-pay | Admitting: Unknown Physician Specialty

## 2016-05-11 DIAGNOSIS — R059 Cough, unspecified: Secondary | ICD-10-CM

## 2016-05-11 DIAGNOSIS — R05 Cough: Secondary | ICD-10-CM

## 2016-05-14 ENCOUNTER — Ambulatory Visit: Payer: Medicare Other | Attending: Unknown Physician Specialty

## 2016-05-14 ENCOUNTER — Other Ambulatory Visit: Payer: Medicare Other

## 2016-06-29 ENCOUNTER — Ambulatory Visit: Payer: Medicare HMO | Admitting: Psychiatry

## 2016-07-15 ENCOUNTER — Ambulatory Visit (INDEPENDENT_AMBULATORY_CARE_PROVIDER_SITE_OTHER): Payer: Medicare HMO | Admitting: Psychiatry

## 2016-07-15 ENCOUNTER — Encounter: Payer: Self-pay | Admitting: Psychiatry

## 2016-07-15 VITALS — BP 102/70 | HR 85 | Temp 97.7°F | Wt 156.2 lb

## 2016-07-15 DIAGNOSIS — F3160 Bipolar disorder, current episode mixed, unspecified: Secondary | ICD-10-CM | POA: Diagnosis not present

## 2016-07-15 DIAGNOSIS — F4310 Post-traumatic stress disorder, unspecified: Secondary | ICD-10-CM

## 2016-07-15 MED ORDER — ESCITALOPRAM OXALATE 10 MG PO TABS: 10.0000 mg | ORAL_TABLET | Freq: Every day | ORAL | 1 refills | Status: DC

## 2016-07-15 MED ORDER — LAMOTRIGINE 150 MG PO TABS: 150.0000 mg | ORAL_TABLET | Freq: Every day | ORAL | 1 refills | Status: DC

## 2016-07-15 MED ORDER — TRAZODONE HCL 100 MG PO TABS: 200.0000 mg | ORAL_TABLET | Freq: Every day | ORAL | 1 refills | Status: DC

## 2016-07-15 MED ORDER — PRAZOSIN HCL 5 MG PO CAPS: 5.0000 mg | ORAL_CAPSULE | Freq: Every day | ORAL | 2 refills | Status: DC

## 2016-07-15 MED ORDER — TOPIRAMATE 100 MG PO TABS: 100.0000 mg | ORAL_TABLET | Freq: Two times a day (BID) | ORAL | 1 refills | Status: DC
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
## 2016-08-19 ENCOUNTER — Ambulatory Visit: Payer: Medicare HMO | Admitting: Psychiatry

## 2016-09-07 ENCOUNTER — Encounter: Payer: Self-pay | Admitting: Psychiatry

## 2016-09-07 ENCOUNTER — Ambulatory Visit (INDEPENDENT_AMBULATORY_CARE_PROVIDER_SITE_OTHER): Payer: Medicare HMO | Admitting: Psychiatry

## 2016-09-07 VITALS — BP 150/95 | HR 125 | Temp 98.4°F | Wt 152.6 lb

## 2016-09-07 DIAGNOSIS — F410 Panic disorder [episodic paroxysmal anxiety] without agoraphobia: Secondary | ICD-10-CM | POA: Diagnosis not present

## 2016-09-07 DIAGNOSIS — F3160 Bipolar disorder, current episode mixed, unspecified: Secondary | ICD-10-CM

## 2016-09-07 MED ORDER — TOPIRAMATE 100 MG PO TABS: 100.0000 mg | ORAL_TABLET | Freq: Two times a day (BID) | ORAL | 1 refills | Status: DC

## 2016-09-07 MED ORDER — PRAZOSIN HCL 5 MG PO CAPS: 5.0000 mg | ORAL_CAPSULE | Freq: Every day | ORAL | 2 refills | Status: DC

## 2016-09-07 MED ORDER — LAMOTRIGINE 150 MG PO TABS: 150.0000 mg | ORAL_TABLET | Freq: Every day | ORAL | 1 refills | Status: DC

## 2016-09-07 MED ORDER — TRAZODONE HCL 100 MG PO TABS: 100.0000 mg | ORAL_TABLET | Freq: Every day | ORAL | 1 refills | Status: DC

## 2016-09-07 MED ORDER — ESCITALOPRAM OXALATE 10 MG PO TABS: 10.0000 mg | ORAL_TABLET | Freq: Every day | ORAL | 1 refills | Status: DC

## 2016-09-07 NOTE — Progress Notes (Signed)
Psychiatric MD Progress Note   Patient Identification: Shelley [name] MRN:  [id number] Date of Evaluation:  09/07/2016 Referral Source: PCP.  Chief Complaint:  Follow up Chief Complaint    Follow-up; Medication Refill     Visit Diagnosis:    ICD-9-CM ICD-10-CM   1. Mixed bipolar I disorder (HCC) 296.60 F31.60   2. Panic anxiety syndrome 300.01 F41.0    Diagnosis:   Patient Active Problem List   Diagnosis Date Noted  . Bipolar disorder (HCC) [F31.9] 07/11/2015  . PTSD (post-traumatic stress disorder) [F43.10] 07/11/2015  . Generalized anxiety disorder [F41.1] 07/11/2015  . Dyspnea [R06.00] 05/09/2014  . Obesity [E66.9] 05/09/2014   History of Present Illness:  Patient is a 51 year old female who presented for follow up appointment. She  Appeared calm and alert during the interview. She reported that she has been enjoying the weather and has been taking care of herself. She reported that she is very excited as she is planning to go to [place] in July to meet her family as well as her daughters and her sisters. Her sister has bought her a ticket as her birthday present. Patient is looking forward to her trip. Patient reported that she get along well with her neighbors and has been planting flowers for them. She stated that she enjoys outdoors and her mood is improving. She appeared hyperactive and alert during the interview. She reported that she sleeps late at night but has been sleeping 7-8 hours on a daily basis. She currently denied having any suicidal homicidal ideations or plans. She denied having any perceptual disturbances. We discussed about her medications. She is receptive to taking her medications on a daily basis.   She denied having any suicidal homicidal ideations or plans.   She gets all her medications to the mail order pharmacy.   Elements:  Severity:  moderate . Associated Signs/Symptoms: Depression Symptoms:  anxiety, decreased appetite, (Hypo) Manic Symptoms:   Irritable Mood, Labiality of Mood, Anxiety Symptoms:  Excessive Worry, Psychotic Symptoms:  none PTSD Symptoms: Negative NA  Past Medical History:  Past Medical History:  Diagnosis Date  . Anxiety   . Bipolar 1 disorder (HCC)   . Chronic kidney disease   . Hypertension   . PTSD (post-traumatic stress disorder)     Past Surgical History:  Procedure Laterality Date  . BLADDER SURGERY    . none     Family History:     Social History:   Social History   Social History  . Marital status: Divorced    Spouse name: N/A  . Number of children: 3  . Years of education: N/A   Social History Main Topics  . Smoking status: Never Smoker  . Smokeless tobacco: Never Used  . Alcohol use No  . Drug use: No  . Sexual activity: Not Currently    Birth control/ protection: None   Other Topics Concern  . None   Social History Narrative  . None   Psychiatric Specialty Exam: HPI  ROS  Blood pressure (!) 150/95, pulse (!) 125, temperature 98.4 F (36.9 C), temperature source Oral, weight 152 lb 9.6 oz (69.2 kg).Body mass index is 26.61 kg/m.  General Appearance: Casual  Eye Contact:  Fair  Speech:  Clear and Coherent  Volume:  Normal  Mood:  Anxious  Affect:  Congruent  Thought Process:  Coherent  Orientation:  Full (Time, Place, and Person)  Thought Content:  WDL  Suicidal Thoughts:  No  Homicidal Thoughts:  No  Memory:  Immediate;   Fair  Judgement:  Intact  Insight:  Fair  Psychomotor Activity:  Normal  Concentration:  Fair  Recall:  [employer]  Fund of Knowledge:Fair  Language: Fair  Akathisia:  No  Handed:  Right  AIMS (if indicated):    Assets:  Communication Skills Desire for Improvement Physical Health Social Support  ADL's:  Intact  Cognition: WNL  Sleep:  2-3   Current Medications: Current Outpatient Prescriptions  Medication Sig Dispense Refill  . b complex vitamins capsule     . escitalopram (LEXAPRO) 10 MG tablet Take 1 tablet (10 mg total) by mouth  daily. 90 tablet 1  . lamoTRIgine (LAMICTAL) 150 MG tablet Take 1 tablet (150 mg total) by mouth daily. 90 tablet 1  . lisinopril (PRINIVIL,ZESTRIL) 10 MG tablet Take 10 mg by mouth daily.    . Magnesium 250 MG TABS Take by mouth.    . Multiple Vitamin (MULTI-VITAMINS) TABS Take by mouth.    . naproxen (NAPROSYN) 500 MG tablet Take 500 mg by mouth daily as needed.    . penicillin v potassium (VEETID) 500 MG tablet     . prazosin (MINIPRESS) 5 MG capsule Take 1 capsule (5 mg total) by mouth at bedtime. 30 capsule 2  . spironolactone (ALDACTONE) 25 MG tablet Take 25 mg by mouth daily.    Marland Kitchen. topiramate (TOPAMAX) 100 MG tablet Take 1 tablet (100 mg total) by mouth 2 (two) times daily. 180 tablet 1  . traZODone (DESYREL) 100 MG tablet Take 2 tablets (200 mg total) by mouth at bedtime. 180 tablet 1  . tretinoin (RETIN-A) 0.025 % gel     . valACYclovir (VALTREX) 1000 MG tablet Take 1,000 mg by mouth 2 (two) times daily.     No current facility-administered medications for this visit.     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 [place]  She moved to [place] 5 years ago. Divorced x 4 years  Has 3 daughters- all in college. Lives by self , on SSI.   Brother died of alcoholism at 1151.    Substance Abuse History in the last 12 months:  no   Consequences of Substance Abuse: Negative NA     Psychiatric Specialty Exam: Medication Refill     ROS  Blood pressure (!) 150/95, pulse (!) 125, temperature 98.4 F (36.9 C), temperature source Oral, weight 152 lb 9.6 oz (69.2 kg).Body mass index is 26.61 kg/m.  General Appearance: Casual  Eye Contact:  Fair  Speech:  Clear and Coherent  Volume:  Normal  Mood:  Anxious  Affect:  Congruent  Thought Process:  Coherent  Orientation:  Full (Time, Place, and Person)  Thought Content:  WDL  Suicidal Thoughts:  No  Homicidal Thoughts:  No  Memory:  Immediate;    Fair  Judgement:  Intact  Insight:  Fair  Psychomotor Activity:  Normal  Concentration:  Fair  Recall:  [employer]  Fund of Knowledge:Fair  Language: Fair  Akathisia:  No  Handed:  Right  AIMS (if indicated):    Assets:  Communication Skills Desire for Improvement Physical Health Social Support  ADL's:  Intact  Cognition: WNL  Sleep:  2-3    Medical Decision Making:  Review of Psycho-Social Stressors (1) and Review and summation of old records (2)  Treatment Plan Summary: Medication management   Discussed with patient about her medications. Continue lamotrigine 150 mg  by mouth daily. Continue Lexapro 10 mg  daily  She will be given trazodone 100 mg at bedtime for insomnia.  Patient is also taking Topamax 100 mg by mouth twice a day Continue  prazosin 5 mg at bedtime -was given a prescription in hand She was given 90 day supply of all her medications Follow-up in 3 months      More than 50% of the time spent in psychoeducation, counseling and coordination of care.    Brandy Hale, MD

## 2016-12-07 ENCOUNTER — Ambulatory Visit (INDEPENDENT_AMBULATORY_CARE_PROVIDER_SITE_OTHER): Payer: Medicare HMO | Admitting: Psychiatry

## 2016-12-07 ENCOUNTER — Encounter: Payer: Self-pay | Admitting: Psychiatry

## 2016-12-07 VITALS — BP 104/64 | HR 100 | Ht 63.5 in | Wt 158.0 lb

## 2016-12-07 DIAGNOSIS — F3160 Bipolar disorder, current episode mixed, unspecified: Secondary | ICD-10-CM

## 2016-12-07 DIAGNOSIS — F410 Panic disorder [episodic paroxysmal anxiety] without agoraphobia: Secondary | ICD-10-CM | POA: Diagnosis not present

## 2016-12-07 MED ORDER — TRAZODONE HCL 100 MG PO TABS: 100.0000 mg | ORAL_TABLET | Freq: Every day | ORAL | 1 refills | Status: DC

## 2016-12-07 MED ORDER — ESCITALOPRAM OXALATE 10 MG PO TABS: 10.0000 mg | ORAL_TABLET | Freq: Every day | ORAL | 1 refills | Status: DC

## 2016-12-07 MED ORDER — PRAZOSIN HCL 5 MG PO CAPS: 5.0000 mg | ORAL_CAPSULE | Freq: Every day | ORAL | 2 refills | Status: DC

## 2016-12-07 MED ORDER — TOPIRAMATE 100 MG PO TABS: 100.0000 mg | ORAL_TABLET | Freq: Two times a day (BID) | ORAL | 1 refills | Status: DC

## 2016-12-07 MED ORDER — LAMOTRIGINE 150 MG PO TABS: 150.0000 mg | ORAL_TABLET | Freq: Every day | ORAL | 1 refills | Status: DC

## 2016-12-07 NOTE — Progress Notes (Signed)
Psychiatric MD Progress Note   Patient Identification: Shelley [name] MRN:  [id number] Date of Evaluation:  12/07/2016 Referral Source: PCP.  Chief Complaint:  Follow up Chief Complaint    Follow-up     Visit Diagnosis:    ICD-10-CM   1. 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 Panic anxiety syndrome F41.0    Diagnosis:   Patient Active Problem List   Diagnosis Date Noted  . Bipolar disorder (HCC) [F31.9] 07/11/2015  . PTSD (post-traumatic stress disorder) [F43.10] 07/11/2015  . Generalized anxiety disorder [F41.1] 07/11/2015  . Dyspnea [R06.00] 05/09/2014  . Obesity [E66.9] 05/09/2014   History of Present Illness:  Patient is a 51 year old female who presented for follow up appointment. She appeared apprehensive as she reported that she went to [place] to visit her sisters but she was very anxious over there. She did not go out to meet her family members. Patient reported that she stayed with her  sister and came back after 10 days. She stated that she was not in her comfort zone. After she returned she is feeling better. She has been compliant with her medications. She stated that she wants to apologize to her sisters about her behavior. She stated that she is planning to invite them to visit her at this time. She has been sleeping well with her medications. She denied having any suicidal homicidal ideations or plans.  She denied having any perceptual disturbances. We discussed about her medications.   She gets all her medications to the mail order pharmacy.   Elements:  Severity:  moderate . Associated Signs/Symptoms: Depression Symptoms:  anxiety, decreased appetite, (Hypo) Manic Symptoms:  Irritable Mood, Labiality of Mood, Anxiety Symptoms:  Excessive Worry, Psychotic Symptoms:  none PTSD Symptoms: Negative NA  Past Medical History:  Past Medical History:  Diagnosis Date  . Anxiety   . Bipolar 1 disorder (HCC)   . Chronic kidney disease   . Hypertension   . PTSD  (post-traumatic stress disorder)     Past Surgical History:  Procedure Laterality Date  . BLADDER SURGERY    . none     Family History:     Social History:   Social History   Social History  . Marital status: Divorced    Spouse name: N/A  . Number of children: 3  . Years of education: N/A   Social History Main Topics  . Smoking status: Never Smoker  . Smokeless tobacco: Never Used  . Alcohol use No  . Drug use: No  . Sexual activity: Not Currently    Birth control/ protection: None   Other Topics Concern  . None   Social History Narrative  . None   Psychiatric Specialty Exam: HPI  ROS  Blood pressure 104/64, pulse 100, height 5' 3.5" (1.613 m), weight 158 lb (71.7 kg).Body mass index is 27.55 kg/m.  General Appearance: Casual  Eye Contact:  Fair  Speech:  Clear and Coherent  Volume:  Normal  Mood:  Anxious  Affect:  Congruent  Thought Process:  Coherent  Orientation:  Full (Time, Place, and Person)  Thought Content:  WDL  Suicidal Thoughts:  No  Homicidal Thoughts:  No  Memory:  Immediate;   Fair  Judgement:  Intact  Insight:  Fair  Psychomotor Activity:  Normal  Concentration:  Fair  Recall:  [employer] of Knowledge:Fair  Language: Fair  Akathisia:  No  Handed:  Right  AIMS (if indicated):    Assets:  Communication Skills  Desire for Improvement Physical Health Social Support  ADL's:  Intact  Cognition: WNL  Sleep:  2-3   Current Medications: Current Outpatient Prescriptions  Medication Sig Dispense Refill  . b complex vitamins capsule     . cholecalciferol (VITAMIN D) 1000 units tablet Take 1,000 Units by mouth daily.    Marland Kitchen escitalopram (LEXAPRO) 10 MG tablet Take 1 tablet (10 mg total) by mouth daily. 90 tablet 1  . lamoTRIgine (LAMICTAL) 150 MG tablet Take 1 tablet (150 mg total) by mouth daily. 90 tablet 1  . Multiple Vitamin (MULTI-VITAMINS) TABS Take by mouth.    . naproxen (NAPROSYN) 500 MG tablet Take 500 mg by mouth daily as needed.     . prazosin (MINIPRESS) 5 MG capsule Take 1 capsule (5 mg total) by mouth at bedtime. 90 capsule 2  . topiramate (TOPAMAX) 100 MG tablet Take 1 tablet (100 mg total) by mouth 2 (two) times daily. 180 tablet 1  . traZODone (DESYREL) 100 MG tablet Take 1 tablet (100 mg total) by mouth at bedtime. 90 tablet 1  . valACYclovir (VALTREX) 1000 MG tablet Take 1,000 mg by mouth 2 (two) times daily.    Marland Kitchen lisinopril (PRINIVIL,ZESTRIL) 10 MG tablet Take 10 mg by mouth daily.    . Magnesium 250 MG TABS Take by mouth.    . penicillin v potassium (VEETID) 500 MG tablet     . spironolactone (ALDACTONE) 25 MG tablet Take 25 mg by mouth daily.    Marland Kitchen tretinoin (RETIN-A) 0.025 % gel      No current facility-administered medications for this visit.     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 [place]  She moved to [place] 5 years ago. Divorced x 4 years  Has 3 daughters- all in college. Lives by self , on SSI.   Brother died of alcoholism at 64.    Substance Abuse History in the last 12 months:  no   Consequences of Substance Abuse: Negative NA     Psychiatric Specialty Exam: Medication Refill     ROS  Blood pressure 104/64, pulse 100, height 5' 3.5" (1.613 m), weight 158 lb (71.7 kg).Body mass index is 27.55 kg/m.  General Appearance: Casual  Eye Contact:  Fair  Speech:  Clear and Coherent  Volume:  Normal  Mood:  Anxious  Affect:  Congruent  Thought Process:  Coherent  Orientation:  Full (Time, Place, and Person)  Thought Content:  WDL  Suicidal Thoughts:  No  Homicidal Thoughts:  No  Memory:  Immediate;   Fair  Judgement:  Intact  Insight:  Fair  Psychomotor Activity:  Normal  Concentration:  Fair  Recall:  [employer] of Knowledge:Fair  Language: Fair  Akathisia:  No  Handed:  Right  AIMS (if indicated):    Assets:  Communication Skills Desire for Improvement Physical Health Social  Support  ADL's:  Intact  Cognition: WNL  Sleep:  2-3    Medical Decision Making:  Review of Psycho-Social Stressors (1) and Review and summation of old records (2)  Treatment Plan Summary: Medication management   Discussed with patient about her medications. Continue lamotrigine 150 mg  by mouth daily. Continue Lexapro 10 mg  daily  She will be given trazodone 100 mg at bedtime for insomnia.  Patient is also taking Topamax 100 mg by mouth twice a day Continue  prazosin 5 mg at bedtime -was given  a prescription in hand She was given 90 day supply of all her medications Follow-up in 3 months      More than 50% of the time spent in psychoeducation, counseling and coordination of care.    Brandy Hale, MD

## 2017-03-01 ENCOUNTER — Ambulatory Visit: Payer: Medicare HMO | Admitting: Psychiatry

## 2017-11-01 ENCOUNTER — Other Ambulatory Visit: Payer: Self-pay

## 2017-11-01 ENCOUNTER — Ambulatory Visit (INDEPENDENT_AMBULATORY_CARE_PROVIDER_SITE_OTHER): Payer: Medicare HMO | Admitting: Psychiatry

## 2017-11-01 ENCOUNTER — Encounter: Payer: Self-pay | Admitting: Psychiatry

## 2017-11-01 VITALS — BP 102/70 | HR 132 | Temp 99.1°F | Wt 148.8 lb

## 2017-11-01 DIAGNOSIS — F3160 Bipolar disorder, current episode mixed, unspecified: Secondary | ICD-10-CM | POA: Diagnosis not present

## 2017-11-01 MED ORDER — ESCITALOPRAM OXALATE 10 MG PO TABS: 10.0000 mg | ORAL_TABLET | Freq: Every day | ORAL | 0 refills | Status: DC

## 2017-11-01 MED ORDER — PRAZOSIN HCL 5 MG PO CAPS: 5.0000 mg | ORAL_CAPSULE | Freq: Every day | ORAL | 2 refills | Status: DC

## 2017-11-01 MED ORDER — TOPIRAMATE 50 MG PO TABS: 50.0000 mg | ORAL_TABLET | Freq: Every day | ORAL | 1 refills | Status: DC

## 2017-11-01 MED ORDER — TRAZODONE HCL 100 MG PO TABS: 100.0000 mg | ORAL_TABLET | Freq: Every day | ORAL | 1 refills | Status: DC

## 2017-11-01 NOTE — Progress Notes (Signed)
Psychiatric MD Progress Note   Patient Identification: Shelley [name] MRN:  [id number] Date of Evaluation:  11/01/2017 Referral Source: PCP.  Chief Complaint:  Follow up Chief Complaint    Follow-up; Medication Refill     Visit Diagnosis:    ICD-10-CM   1. 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 Bipolar disorder (HCC) [F31.9] 07/11/2015  . PTSD (post-traumatic stress disorder) [F43.10] 07/11/2015  . Generalized anxiety disorder [F41.1] 07/11/2015  . Dyspnea [R06.00] 05/09/2014  . Obesity [E66.9] 05/09/2014   History of Present Illness:  Patient is a 52 year old female who presented for follow up appointment. She was seen last year in August 2018.  She reported that she went to [place] as her brother passed away 6 months ago.  She reported that she return back 1 month ago.  Patient reported that she has been taking her medications but ran out of Lexapro and lamotrigine.  Patient reported that she still has supply of prazosin Topamax and trazodone.  Patient reported that she has been feeling depressed and was tearful.  She wants to restart her medications.  She is living with her friend at this time.  She currently denied having any suicidal homicidal ideations or plans.  Reported that the medication combination was helpful.  She is able to contract for safety at this time.  She gets her medications from the mail order pharmacy.     Elements:  Severity:  moderate . Associated Signs/Symptoms: Depression Symptoms:  anxiety, decreased appetite, (Hypo) Manic Symptoms:  Irritable Mood, Labiality of Mood, Anxiety Symptoms:  Excessive Worry, Psychotic Symptoms:  none PTSD Symptoms: Negative NA  Past Medical History:  Past Medical History:  Diagnosis Date  . Anxiety   . Bipolar 1 disorder (HCC)   . Chronic kidney disease   . Hypertension   . PTSD (post-traumatic stress disorder)     Past Surgical History:   Procedure Laterality Date  . BLADDER SURGERY    . none     Family History:     Social History:   Social History   Socioeconomic History  . Marital status: Divorced    Spouse name: Not on file  . Number of children: 3  . Years of education: Not on file  . Highest education level: Not on file  Occupational History  . Not on file  Social Needs  . Financial resource strain: Not on file  . Food insecurity:    Worry: Not on file    Inability: Not on file  . Transportation needs:    Medical: Not on file    Non-medical: Not on file  Tobacco Use  . Smoking status: Never Smoker  . Smokeless tobacco: Never Used  Substance and Sexual Activity  . Alcohol use: No    Alcohol/week: 0.0 oz  . Drug use: No  . Sexual activity: Not Currently    Birth control/protection: None  Lifestyle  . Physical activity:    Days per week: Not on file    Minutes per session: Not on file  . Stress: Not on file  Relationships  . Social connections:    Talks on phone: Not on file    Gets together: Not on file    Attends religious service: Not on file    Active member of club or organization: Not on file    Attends meetings of clubs or organizations: Not on file    Relationship  status: Not on file  Other Topics Concern  . Not on file  Social History Narrative  . Not on file   Psychiatric Specialty Exam: HPI  ROS  Blood pressure 102/70, pulse (!) 132, temperature 99.1 F (37.3 C), temperature source Oral, weight 148 lb 12.8 oz (67.5 kg).Body mass index is 25.95 kg/m.  General Appearance: Casual  Eye Contact:  Fair  Speech:  Clear and Coherent  Volume:  Normal  Mood:  Anxious  Affect:  Congruent  Thought Process:  Coherent  Orientation:  Full (Time, Place, and Person)  Thought Content:  WDL  Suicidal Thoughts:  No  Homicidal Thoughts:  No  Memory:  Immediate;   Fair  Judgement:  Intact  Insight:  Fair  Psychomotor Activity:  Normal  Concentration:  Fair  Recall:  [employer] of  Knowledge:Fair  Language: Fair  Akathisia:  No  Handed:  Right  AIMS (if indicated):    Assets:  Communication Skills Desire for Improvement Physical Health Social Support  ADL's:  Intact  Cognition: WNL  Sleep:  2-3   Current Medications: Current Outpatient Medications  Medication Sig Dispense Refill  . b complex vitamins capsule     . cholecalciferol (VITAMIN D) 1000 units tablet Take 1,000 Units by mouth daily.    Marland Kitchen. escitalopram (LEXAPRO) 10 MG tablet Take 1 tablet (10 mg total) by mouth daily. 90 tablet 1  . lamoTRIgine (LAMICTAL) 150 MG tablet Take 1 tablet (150 mg total) by mouth daily. 90 tablet 1  . lisinopril (PRINIVIL,ZESTRIL) 10 MG tablet Take 10 mg by mouth daily.    . Magnesium 250 MG TABS Take by mouth.    . Multiple Vitamin (MULTI-VITAMINS) TABS Take by mouth.    . naproxen (NAPROSYN) 500 MG tablet Take 500 mg by mouth daily as needed.    . penicillin v potassium (VEETID) 500 MG tablet     . prazosin (MINIPRESS) 5 MG capsule Take 1 capsule (5 mg total) by mouth at bedtime. 90 capsule 2  . spironolactone (ALDACTONE) 25 MG tablet Take 25 mg by mouth daily.    Marland Kitchen. topiramate (TOPAMAX) 100 MG tablet Take 1 tablet (100 mg total) by mouth 2 (two) times daily. 180 tablet 1  . traZODone (DESYREL) 100 MG tablet Take 1 tablet (100 mg total) by mouth at bedtime. 90 tablet 1  . tretinoin (RETIN-A) 0.025 % gel     . valACYclovir (VALTREX) 1000 MG tablet Take 1,000 mg by mouth 2 (two) times daily.     No current facility-administered medications for this visit.     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 [place]  She moved to [place] 5 years ago. Divorced x 4 years  Has 3 daughters- all in college. Lives by self , on SSI.   Brother died of alcoholism at 2251.    Substance Abuse History in the last 12 months:  no   Consequences of Substance Abuse: Negative NA     Psychiatric  Specialty Exam: Medication Refill     ROS  Blood pressure 102/70, pulse (!) 132, temperature 99.1 F (37.3 C), temperature source Oral, weight 148 lb 12.8 oz (67.5 kg).Body mass index is 25.95 kg/m.  General Appearance: Casual  Eye Contact:  Fair  Speech:  Clear and Coherent  Volume:  Normal  Mood:  Anxious  Affect:  Congruent  Thought Process:  Coherent  Orientation:  Full (Time,  Place, and Person)  Thought Content:  WDL  Suicidal Thoughts:  No  Homicidal Thoughts:  No  Memory:  Immediate;   Fair  Judgement:  Intact  Insight:  Fair  Psychomotor Activity:  Normal  Concentration:  Fair  Recall:  [employer] of Knowledge:Fair  Language: Fair  Akathisia:  No  Handed:  Right  AIMS (if indicated):    Assets:  Communication Skills Desire for Improvement Physical Health Social Support  ADL's:  Intact  Cognition: WNL  Sleep:  2-3    Medical Decision Making:  Review of Psycho-Social Stressors (1) and Review and summation of old records (2)  Treatment Plan Summary: Medication management   Discussed with patient about her medications.  Continue Lexapro 10 mg  daily  She will be given trazodone 100 mg at bedtime for insomnia.  Patient will start Topamax 50 mg by mouth once a day as she did not bring any prescription medication bottles with her and is not sure if she was really taking any medication. Continue  prazosin 5 mg at bedtime  She was given 90 day supply of some of her medications.  She will follow-up in 1 month to determine her medications as she has not been seen for almost 1 year and she did not bring her medication bottles.  Advised patient to bring her medication bottles and she demonstrated understanding.  Follow-up in 1 month   More than 50% of the time spent in psychoeducation, counseling and coordination of care.    Brandy Hale, MD

## 2017-11-05 ENCOUNTER — Other Ambulatory Visit: Payer: Self-pay | Admitting: Child and Adolescent Psychiatry

## 2017-11-05 MED ORDER — LAMOTRIGINE 150 MG PO TABS: 150.0000 mg | ORAL_TABLET | Freq: Every day | ORAL | 0 refills | Status: DC

## 2017-11-05 NOTE — Progress Notes (Signed)
Received a call from pt that she needs refill on Lamotrigine as she ran out last night. She was seen by Dr. Garnetta BuddyFaheem in the clinic after about year on 07/15. Her notes were reviewed of the visit on 11/01/2017. Pt reports that she has been taking Lamotrigine every day, and has never stopped it. Writer made sure that pt understand the risks of restarting Lamictal at a high dose including [employer] [facility] [employer]. Pt verbalized understanding, re-iterated that she has been adherent to Lamictal and confirmed the dose of 150 mg once day. Sent the rx of Lamictal 150 mg once daily to [facility] in [place] as she requested.

## 2017-11-29 ENCOUNTER — Ambulatory Visit: Payer: Medicare HMO | Admitting: Psychiatry

## 2017-12-13 ENCOUNTER — Ambulatory Visit: Payer: Medicare HMO | Admitting: Psychiatry

## 2018-01-03 ENCOUNTER — Ambulatory Visit (INDEPENDENT_AMBULATORY_CARE_PROVIDER_SITE_OTHER): Payer: Medicare HMO | Admitting: Psychiatry

## 2018-01-03 ENCOUNTER — Encounter: Payer: Self-pay | Admitting: Psychiatry

## 2018-01-03 ENCOUNTER — Other Ambulatory Visit: Payer: Self-pay

## 2018-01-03 VITALS — BP 130/86 | HR 138 | Temp 98.2°F | Wt 146.8 lb

## 2018-01-03 DIAGNOSIS — F410 Panic disorder [episodic paroxysmal anxiety] without agoraphobia: Secondary | ICD-10-CM | POA: Diagnosis not present

## 2018-01-03 DIAGNOSIS — F3160 Bipolar disorder, current episode mixed, unspecified: Secondary | ICD-10-CM | POA: Diagnosis not present

## 2018-01-03 MED ORDER — ESCITALOPRAM OXALATE 10 MG PO TABS: 10.0000 mg | ORAL_TABLET | Freq: Every day | ORAL | 1 refills | Status: DC

## 2018-01-03 MED ORDER — TOPIRAMATE 50 MG PO TABS: 50.0000 mg | ORAL_TABLET | Freq: Every day | ORAL | 1 refills | Status: DC

## 2018-01-03 MED ORDER — PRAZOSIN HCL 5 MG PO CAPS: 5.0000 mg | ORAL_CAPSULE | Freq: Every day | ORAL | 2 refills | Status: DC

## 2018-01-03 MED ORDER — TRAZODONE HCL 100 MG PO TABS: 100.0000 mg | ORAL_TABLET | Freq: Every day | ORAL | 1 refills | Status: DC

## 2018-01-03 MED ORDER — LAMOTRIGINE 150 MG PO TABS: 150.0000 mg | ORAL_TABLET | Freq: Every day | ORAL | 1 refills | Status: DC

## 2018-01-03 NOTE — Progress Notes (Signed)
Psychiatric MD Progress Note   Patient Identification: Shelley [name] MRN:  [id number] Date of Evaluation:  01/03/2018 Referral Source: PCP.  Chief Complaint:  Follow up Chief Complaint    Follow-up; Medication Refill; Anxiety     Visit Diagnosis:    ICD-10-CM   1. 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 Panic anxiety syndrome F41.0    Diagnosis:   Patient Active Problem List   Diagnosis Date Noted  . Bipolar disorder (HCC) [F31.9] 07/11/2015  . PTSD (post-traumatic stress disorder) [F43.10] 07/11/2015  . Generalized anxiety disorder [F41.1] 07/11/2015  . Dyspnea [R06.00] 05/09/2014  . Obesity [E66.9] 05/09/2014   History of Present Illness:  Patient is a 52 year old female who presented for follow up appointment. She stated that she has been feeling tired.  She spends most of the time at home alone by herself.  She stated that she sleeps late at night around 2 AM she has been reading books and watching television.  Patient stated that she has been compliant with her medication.  She called after his last appointment for the refill on the lamotrigine.  She stated that the medications are helping her.  She has been depressed since the death of her brother in the spring but has been dealing with it.  She stated that they are 8 siblings but 3 are already deceased now.  She reported that she is the baby.  She stated that she has been calling her sister and her daughter trying to make peace with them.  She has good support system at this time.  Patient reported that she is compliant with her medication.  She is also planning to join the swim club in her area so she can stay healthy during the fall and the winter.  She denied having any suicidal ideations or plans.  She appeared calm and alert during the interview.  No acute symptoms noted at this time.       Elements:  Severity:  moderate . Associated Signs/Symptoms: Depression Symptoms:  anxiety, decreased appetite, (Hypo) Manic  Symptoms:  Irritable Mood, Labiality of Mood, Anxiety Symptoms:  Excessive Worry, Psychotic Symptoms:  none PTSD Symptoms: Negative NA  Past Medical History:  Past Medical History:  Diagnosis Date  . Anxiety   . Bipolar 1 disorder (HCC)   . Chronic kidney disease   . Hypertension   . PTSD (post-traumatic stress disorder)     Past Surgical History:  Procedure Laterality Date  . BLADDER SURGERY    . none     Family History:     Social History:   Social History   Socioeconomic History  . Marital status: Divorced    Spouse name: Not on file  . Number of children: 3  . Years of education: Not on file  . Highest education level: Not on file  Occupational History  . Not on file  Social Needs  . Financial resource strain: Not on file  . Food insecurity:    Worry: Not on file    Inability: Not on file  . Transportation needs:    Medical: Not on file    Non-medical: Not on file  Tobacco Use  . Smoking status: Never Smoker  . Smokeless tobacco: Never Used  Substance and Sexual Activity  . Alcohol use: No    Alcohol/week: 0.0 standard drinks  . Drug use: No  . Sexual activity: Not Currently    Birth control/protection: None  Lifestyle  . Physical activity:  Days per week: Not on file    Minutes per session: Not on file  . Stress: Not on file  Relationships  . Social connections:    Talks on phone: Not on file    Gets together: Not on file    Attends religious service: Not on file    Active member of club or organization: Not on file    Attends meetings of clubs or organizations: Not on file    Relationship status: Not on file  Other Topics Concern  . Not on file  Social History Narrative  . Not on file   Psychiatric Specialty Exam: HPI  ROS  Blood pressure 130/86, pulse (!) 138, temperature 98.2 F (36.8 C), temperature source Oral, weight 146 lb 12.8 oz (66.6 kg).Body mass index is 25.6 kg/m.  General Appearance: Casual  Eye Contact:  Fair   Speech:  Clear and Coherent  Volume:  Normal  Mood:  Anxious  Affect:  Congruent  Thought Process:  Coherent  Orientation:  Full (Time, Place, and Person)  Thought Content:  WDL  Suicidal Thoughts:  No  Homicidal Thoughts:  No  Memory:  Immediate;   Fair  Judgement:  Intact  Insight:  Fair  Psychomotor Activity:  Normal  Concentration:  Fair  Recall:  [employer] of Knowledge:Fair  Language: Fair  Akathisia:  No  Handed:  Right  AIMS (if indicated):    Assets:  Communication Skills Desire for Improvement Physical Health Social Support  ADL's:  Intact  Cognition: WNL  Sleep:  2-3   Current Medications: Current Outpatient Medications  Medication Sig Dispense Refill  . Ascorbic Acid (VITAMIN C) 500 MG CAPS     . B Complex Vitamins (VITAMIN B COMPLEX PO)     . b complex vitamins capsule     . cholecalciferol (VITAMIN D) 1000 units tablet Take 1,000 Units by mouth daily.    . cholecalciferol (VITAMIN D) 1000 units tablet     . escitalopram (LEXAPRO) 10 MG tablet Take 1 tablet (10 mg total) by mouth daily. 30 tablet 0  . lamoTRIgine (LAMICTAL) 150 MG tablet Take 1 tablet (150 mg total) by mouth daily. 30 tablet 0  . Magnesium 250 MG TABS Take by mouth.    . Multiple Vitamin (MULTI-VITAMINS) TABS Take by mouth.    . MULTIPLE VITAMINS PO     . prazosin (MINIPRESS) 5 MG capsule Take 1 capsule (5 mg total) by mouth at bedtime. 90 capsule 2  . topiramate (TOPAMAX) 50 MG tablet Take 1 tablet (50 mg total) by mouth daily. 30 tablet 1  . traZODone (DESYREL) 100 MG tablet Take 1 tablet (100 mg total) by mouth at bedtime. 90 tablet 1   No current facility-administered medications for this visit.     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 [place]  She moved to [place] 5 years ago. Divorced x 4 years  Has 3 daughters- all in college. Lives by self , on SSI.   Brother died of alcoholism at 45.     Substance Abuse History in the last 12 months:  no   Consequences of Substance Abuse: Negative NA     Psychiatric Specialty Exam: Medication Refill  Pertinent negatives include no abdominal pain or rash.    Review of Systems  Constitutional: Positive for malaise/fatigue.  HENT: Negative for sinus pain.   Eyes: Negative for photophobia.  Respiratory: Negative  for sputum production.   Cardiovascular: Negative for orthopnea.  Gastrointestinal: Negative for abdominal pain.  Genitourinary: Negative for frequency.  Skin: Negative for rash.  Neurological: Negative for tingling.  Endo/Heme/Allergies: Negative for environmental allergies.  Psychiatric/Behavioral: Positive for depression. The patient is nervous/anxious.     Blood pressure 130/86, pulse (!) 138, temperature 98.2 F (36.8 C), temperature source Oral, weight 146 lb 12.8 oz (66.6 kg).Body mass index is 25.6 kg/m.  General Appearance: Casual  Eye Contact:  Fair  Speech:  Clear and Coherent  Volume:  Normal  Mood:  Anxious  Affect:  Congruent  Thought Process:  Coherent  Orientation:  Full (Time, Place, and Person)  Thought Content:  WDL  Suicidal Thoughts:  No  Homicidal Thoughts:  No  Memory:  Immediate;   Fair  Judgement:  Intact  Insight:  Fair  Psychomotor Activity:  Normal  Concentration:  Fair  Recall:  [employer] of Knowledge:Fair  Language: Fair  Akathisia:  No  Handed:  Right  AIMS (if indicated):    Assets:  Communication Skills Desire for Improvement Physical Health Social Support  ADL's:  Intact  Cognition: WNL  Sleep:  2-3    Medical Decision Making:  Review of Psycho-Social Stressors (1) and Review and summation of old records (2)  Treatment Plan Summary: Medication management   Discussed with patient about her medications.  Continue Lexapro 10 mg  daily  She will be given trazodone 100 mg at bedtime for insomnia.   Topamax 50 mg by mouth once a day Continue  prazosin 5 mg  at bedtime . Lamictal 150 mg daily. Patient refill for 90-day supply through [facility] pharmacy   Follow-up in 2 month   More than 50% of the time spent in psychoeducation, counseling and coordination of care.    Brandy Hale, MD

## 2018-02-28 ENCOUNTER — Ambulatory Visit (INDEPENDENT_AMBULATORY_CARE_PROVIDER_SITE_OTHER): Payer: Medicare HMO | Admitting: Psychiatry

## 2018-02-28 ENCOUNTER — Encounter: Payer: Self-pay | Admitting: Psychiatry

## 2018-02-28 DIAGNOSIS — F3160 Bipolar disorder, current episode mixed, unspecified: Secondary | ICD-10-CM

## 2018-02-28 NOTE — Progress Notes (Signed)
Psychiatric MD Progress Note   Patient Identification: Shelley [name] MRN:  [id number] Date of Evaluation:  02/28/2018 Referral Source: PCP.  Chief Complaint:  Follow up  Visit Diagnosis:    ICD-10-CM   1. 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 Bipolar disorder (HCC) [F31.9] 07/11/2015  . PTSD (post-traumatic stress disorder) [F43.10] 07/11/2015  . Generalized anxiety disorder [F41.1] 07/11/2015  . Dyspnea [R06.00] 05/09/2014  . Obesity [E66.9] 05/09/2014   History of Present Illness:  Patient is a 52 year old female who presented for follow up appointment. She stated that she has been feeling tired and has 2-3 days in a week when she spends most of the time alone by herself.  She reported that she was home alone for the past 3 days.  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  She reported that she was feeling guilty due to the same.  She reported that she does not have any plans to hurt herself at this time.  She has been compliant with her medications.  She reported that the current medications are helping her.  She wants to start therapy again.  She was following with a therapist in the past but was dismissed from the practice because of her behavior and noncompliant with the therapy appointments.  Patient reported that she sleeps well at night.  She denied having any suicidal homicidal ideations or plans.           Elements:  Severity:  moderate . Associated Signs/Symptoms: Depression Symptoms:  anxiety, decreased appetite, (Hypo) Manic Symptoms:  Irritable Mood, Labiality of Mood, Anxiety Symptoms:  Excessive Worry, Psychotic Symptoms:  none PTSD Symptoms: Negative NA  Past Medical History:  Past Medical History:  Diagnosis Date  . Anxiety   . Bipolar 1 disorder (HCC)   . Chronic kidney disease    . Hypertension   . PTSD (post-traumatic stress disorder)     Past Surgical History:  Procedure Laterality Date  . BLADDER SURGERY    . none     Family History:     Social History:   Social History   Socioeconomic History  . Marital status: Divorced    Spouse name: Not on file  . Number of children: 3  . Years of education: Not on file  . Highest education level: Not on file  Occupational History  . Not on file  Social Needs  . Financial resource strain: Not on file  . Food insecurity:    Worry: Not on file    Inability: Not on file  . Transportation needs:    Medical: Not on file    Non-medical: Not on file  Tobacco Use  . Smoking status: Never Smoker  . Smokeless tobacco: Never Used  Substance and Sexual Activity  . Alcohol use: No    Alcohol/week: 0.0 standard drinks  . Drug use: No  . Sexual activity: Not Currently    Birth control/protection: None  Lifestyle  . Physical activity:    Days per week: Not on file    Minutes per session: Not on file  . Stress: Not on file  Relationships  . Social connections:    Talks on phone: Not on file    Gets together: Not on file    Attends religious service: Not on  file    Active member of club or organization: Not on file    Attends meetings of clubs or organizations: Not on file    Relationship status: Not on file  Other Topics Concern  . Not on file  Social History Narrative  . Not on file   Psychiatric Specialty Exam: HPI  ROS  There were no vitals taken for this visit.There is no height or weight on file to calculate BMI.  General Appearance: Casual  Eye Contact:  Fair  Speech:  Clear and Coherent  Volume:  Normal  Mood:  Anxious  Affect:  Congruent  Thought Process:  Coherent  Orientation:  Full (Time, Place, and Person)  Thought Content:  WDL  Suicidal Thoughts:  No  Homicidal Thoughts:  No  Memory:  Immediate;   Fair  Judgement:  Intact  Insight:  Fair  Psychomotor Activity:  Normal   Concentration:  Fair  Recall:  [employer] of Knowledge:Fair  Language: Fair  Akathisia:  No  Handed:  Right  AIMS (if indicated):    Assets:  Communication Skills Desire for Improvement Physical Health Social Support  ADL's:  Intact  Cognition: WNL  Sleep:  2-3   Current Medications: Current Outpatient Medications  Medication Sig Dispense Refill  . Ascorbic Acid (VITAMIN C) 500 MG CAPS     . B Complex Vitamins (VITAMIN B COMPLEX PO)     . b complex vitamins capsule     . cholecalciferol (VITAMIN D) 1000 units tablet Take 1,000 Units by mouth daily.    . cholecalciferol (VITAMIN D) 1000 units tablet     . escitalopram (LEXAPRO) 10 MG tablet Take 1 tablet (10 mg total) by mouth daily. 90 tablet 1  . lamoTRIgine (LAMICTAL) 150 MG tablet Take 1 tablet (150 mg total) by mouth daily. 90 tablet 1  . Magnesium 250 MG TABS Take by mouth.    . Multiple Vitamin (MULTI-VITAMINS) TABS Take by mouth.    . MULTIPLE VITAMINS PO     . prazosin (MINIPRESS) 5 MG capsule Take 1 capsule (5 mg total) by mouth at bedtime. 90 capsule 2  . topiramate (TOPAMAX) 50 MG tablet Take 1 tablet (50 mg total) by mouth daily. 90 tablet 1  . traZODone (DESYREL) 100 MG tablet Take 1 tablet (100 mg total) by mouth at bedtime. 90 tablet 1   No current facility-administered medications for this visit.     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 [place]  She moved to [place] 5 years ago. Divorced x 4 years  Has 3 daughters- all in college. Lives by self , on SSI.   Brother died of alcoholism at 58.    Substance Abuse History in the last 12 months:  no   Consequences of Substance Abuse: Negative NA     Psychiatric Specialty Exam: Medication Refill  Pertinent negatives include no abdominal pain or rash.    Review of Systems  Constitutional: Positive for malaise/fatigue.  HENT: Negative for sinus pain.   Eyes:  Negative for photophobia.  Respiratory: Negative for sputum production.   Cardiovascular: Negative for orthopnea.  Gastrointestinal: Negative for abdominal pain.  Genitourinary: Negative for frequency.  Skin: Negative for rash.  Neurological: Negative for tingling.  Endo/Heme/Allergies: Negative for environmental allergies.  Psychiatric/Behavioral: Positive for depression. The patient is nervous/anxious.     There were no vitals taken for this visit.There is no height  or weight on file to calculate BMI.  General Appearance: Casual  Eye Contact:  Fair  Speech:  Clear and Coherent  Volume:  Normal  Mood:  Anxious  Affect:  Congruent  Thought Process:  Coherent  Orientation:  Full (Time, Place, and Person)  Thought Content:  WDL  Suicidal Thoughts:  No  Homicidal Thoughts:  No  Memory:  Immediate;   Fair  Judgement:  Intact  Insight:  Fair  Psychomotor Activity:  Normal  Concentration:  Fair  Recall:  [employer] of Knowledge:Fair  Language: Fair  Akathisia:  No  Handed:  Right  AIMS (if indicated):    Assets:  Communication Skills Desire for Improvement Physical Health Social Support  ADL's:  Intact  Cognition: WNL  Sleep:  2-3    Medical Decision Making:  Review of Psycho-Social Stressors (1) and Review and summation of old records (2)  Treatment Plan Summary: Medication management   Discussed with patient about her medications.  Continue Lexapro 10 mg  daily  She will be given trazodone 100 mg at bedtime for insomnia.   Topamax 50 mg by mouth once a day Continue  prazosin 5 mg at bedtime . Lamictal 150 mg daily. Patient refill for 90-day supply through [facility] pharmacy   Follow-up in 2 month   More than 50% of the time spent in psychoeducation, counseling and coordination of care.    Brandy Hale, MD

## 2018-04-05 ENCOUNTER — Other Ambulatory Visit: Payer: Self-pay | Admitting: Psychiatry

## 2018-04-25 ENCOUNTER — Encounter: Payer: Self-pay | Admitting: Licensed Clinical Social Worker

## 2018-04-25 ENCOUNTER — Ambulatory Visit (INDEPENDENT_AMBULATORY_CARE_PROVIDER_SITE_OTHER): Payer: Medicare Other | Admitting: Psychiatry

## 2018-04-25 ENCOUNTER — Ambulatory Visit (INDEPENDENT_AMBULATORY_CARE_PROVIDER_SITE_OTHER): Payer: Medicare Other | Admitting: Licensed Clinical Social Worker

## 2018-04-25 ENCOUNTER — Encounter: Payer: Self-pay | Admitting: Psychiatry

## 2018-04-25 DIAGNOSIS — F410 Panic disorder [episodic paroxysmal anxiety] without agoraphobia: Secondary | ICD-10-CM

## 2018-04-25 DIAGNOSIS — F3160 Bipolar disorder, current episode mixed, unspecified: Secondary | ICD-10-CM

## 2018-04-25 DIAGNOSIS — F4310 Post-traumatic stress disorder, unspecified: Secondary | ICD-10-CM | POA: Diagnosis not present

## 2018-04-25 MED ORDER — TRAZODONE HCL 100 MG PO TABS: 100.0000 mg | ORAL_TABLET | Freq: Every day | ORAL | 1 refills | Status: DC

## 2018-04-25 MED ORDER — LAMOTRIGINE 150 MG PO TABS: 150.0000 mg | ORAL_TABLET | Freq: Every day | ORAL | 1 refills | Status: DC

## 2018-04-25 MED ORDER — TOPIRAMATE 50 MG PO TABS: 50.0000 mg | ORAL_TABLET | Freq: Every day | ORAL | 1 refills | Status: DC

## 2018-04-25 MED ORDER — ESCITALOPRAM OXALATE 10 MG PO TABS: 10.0000 mg | ORAL_TABLET | Freq: Every day | ORAL | 1 refills | Status: DC

## 2018-04-25 MED ORDER — PRAZOSIN HCL 5 MG PO CAPS: 5.0000 mg | ORAL_CAPSULE | Freq: Every day | ORAL | 1 refills | Status: DC

## 2018-04-25 NOTE — Progress Notes (Signed)
Psychiatric MD Progress Note   Patient Identification: Shelley [name] MRN:  [id number] Date of Evaluation:  04/25/2018 Referral Source: PCP.  Chief Complaint:  Follow up  Visit Diagnosis:    ICD-10-CM   1. 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 Panic anxiety syndrome F41.0    Diagnosis:   Patient Active Problem List   Diagnosis Date Noted  . Bipolar disorder (HCC) [F31.9] 07/11/2015  . PTSD (post-traumatic stress disorder) [F43.10] 07/11/2015  . Generalized anxiety disorder [F41.1] 07/11/2015  . Dyspnea [R06.00] 05/09/2014  . Obesity [E66.9] 05/09/2014   History of Present Illness:  Patient is a 53 year old female who presented for follow up appointment. She stated that she has been feeling tired and not to anything over the holidays.  She was concerned about her neighbor and was talking in detail about the same.  She reported that she has been helping her 19 year old neighbor and has been keeping an eye on her.  Patient reported that she has been compliant with her medications.  She reported that her insurance has recently changed and she wants to take the medications to the pharmacy as she does not know about her mail order pharmacy at this time.  She has been compliant with her medications.  She reported that she sleeps well at this time.  She lives alone by herself and was upset about the behavior of her sisters as they did not help her during the holidays.  Patient reported that she does not use any drugs or alcohol.  She denied having any perceptual disturbances.  She denied having any suicidal homicidal ideations or plans.             Elements:  Severity:  moderate . Associated Signs/Symptoms: Depression Symptoms:  anxiety, decreased appetite, (Hypo) Manic Symptoms:  Irritable Mood, Labiality of Mood, Anxiety Symptoms:  Excessive Worry, Psychotic Symptoms:  none PTSD Symptoms: Negative NA  Past Medical History:  Past Medical History:  Diagnosis Date  .  Anxiety   . Bipolar 1 disorder (HCC)   . Chronic kidney disease   . Hypertension   . PTSD (post-traumatic stress disorder)     Past Surgical History:  Procedure Laterality Date  . BLADDER SURGERY    . none     Family History:     Social History:   Social History   Socioeconomic History  . Marital status: Divorced    Spouse name: Not on file  . Number of children: 3  . Years of education: Not on file  . Highest education level: Not on file  Occupational History  . Not on file  Social Needs  . Financial resource strain: Not on file  . Food insecurity:    Worry: Not on file    Inability: Not on file  . Transportation needs:    Medical: Not on file    Non-medical: Not on file  Tobacco Use  . Smoking status: Never Smoker  . Smokeless tobacco: Never Used  Substance and Sexual Activity  . Alcohol use: No    Alcohol/week: 0.0 standard drinks  . Drug use: No  . Sexual activity: Not Currently    Birth control/protection: None  Lifestyle  . Physical activity:    Days per week: Not on file    Minutes per session: Not on file  . Stress: Not on file  Relationships  . Social connections:    Talks on phone: Not on file    Gets together: Not on file  Attends religious service: Not on file    Active member of club or organization: Not on file    Attends meetings of clubs or organizations: Not on file    Relationship status: Not on file  Other Topics Concern  . Not on file  Social History Narrative  . Not on file   Psychiatric Specialty Exam: HPI  ROS  There were no vitals taken for this visit.There is no height or weight on file to calculate BMI.  General Appearance: Casual  Eye Contact:  Fair  Speech:  Clear and Coherent  Volume:  Normal  Mood:  Anxious  Affect:  Congruent  Thought Process:  Coherent  Orientation:  Full (Time, Place, and Person)  Thought Content:  WDL  Suicidal Thoughts:  No  Homicidal Thoughts:  No  Memory:  Immediate;   Fair  Judgement:   Intact  Insight:  Fair  Psychomotor Activity:  Normal  Concentration:  Fair  Recall:  [employer]  Fund of Knowledge:Fair  Language: Fair  Akathisia:  No  Handed:  Right  AIMS (if indicated):    Assets:  Communication Skills Desire for Improvement Physical Health Social Support  ADL's:  Intact  Cognition: WNL  Sleep:  2-3   Current Medications: Current Outpatient Medications  Medication Sig Dispense Refill  . Ascorbic Acid (VITAMIN C) 500 MG CAPS     . B Complex Vitamins (VITAMIN B COMPLEX PO)     . b complex vitamins capsule     . cholecalciferol (VITAMIN D) 1000 units tablet Take 1,000 Units by mouth daily.    . cholecalciferol (VITAMIN D) 1000 units tablet     . escitalopram (LEXAPRO) 10 MG tablet Take 1 tablet (10 mg total) by mouth daily. 90 tablet 1  . lamoTRIgine (LAMICTAL) 150 MG tablet Take 1 tablet (150 mg total) by mouth daily. 90 tablet 1  . Magnesium 250 MG TABS Take by mouth.    . Multiple Vitamin (MULTI-VITAMINS) TABS Take by mouth.    . MULTIPLE VITAMINS PO     . prazosin (MINIPRESS) 5 MG capsule Take 1 capsule (5 mg total) by mouth at bedtime. 90 capsule 2  . topiramate (TOPAMAX) 50 MG tablet Take 1 tablet (50 mg total) by mouth daily. 90 tablet 1  . traZODone (DESYREL) 100 MG tablet Take 1 tablet (100 mg total) by mouth at bedtime. 90 tablet 1   No current facility-administered medications for this visit.     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 [place]  She moved to [place] 5 years ago. Divorced x 4 years  Has 3 daughters- all in college. Lives by self , on SSI.   Brother died of alcoholism at [id number].    Substance Abuse History in the last 12 months:  no   Consequences of Substance Abuse: Negative NA     Psychiatric Specialty Exam: Medication Refill  Pertinent negatives include no abdominal pain or rash.    Review of Systems  Constitutional: Positive for  malaise/fatigue.  HENT: Negative for sinus pain.   Eyes: Negative for photophobia.  Respiratory: Negative for sputum production.   Cardiovascular: Negative for orthopnea.  Gastrointestinal: Negative for abdominal pain.  Genitourinary: Negative for frequency.  Skin: Negative for rash.  Neurological: Negative for tingling.  Endo/Heme/Allergies: Negative for environmental allergies.  Psychiatric/Behavioral: Positive for depression. The patient is nervous/anxious.     There were no vitals taken for  this visit.There is no height or weight on file to calculate BMI.  General Appearance: Casual  Eye Contact:  Fair  Speech:  Clear and Coherent  Volume:  Normal  Mood:  Anxious  Affect:  Congruent  Thought Process:  Coherent  Orientation:  Full (Time, Place, and Person)  Thought Content:  WDL  Suicidal Thoughts:  No  Homicidal Thoughts:  No  Memory:  Immediate;   Fair  Judgement:  Intact  Insight:  Fair  Psychomotor Activity:  Normal  Concentration:  Fair  Recall:  [employer] of Knowledge:Fair  Language: Fair  Akathisia:  No  Handed:  Right  AIMS (if indicated):    Assets:  Communication Skills Desire for Improvement Physical Health Social Support  ADL's:  Intact  Cognition: WNL  Sleep:  2-3    Medical Decision Making:  Review of Psycho-Social Stressors (1) and Review and summation of old records (2)  Treatment Plan Summary: Medication management   Discussed with patient about her medications.  Continue Lexapro 10 mg  daily  She will be given trazodone 100 mg at bedtime for insomnia.   Topamax 50 mg by mouth once a day Continue  prazosin 5 mg at bedtime . Lamictal 150 mg daily. Patient refill for 90-day supply through [facility] pharmacy   Follow-up in 2 month   More than 50% of the time spent in psychoeducation, counseling and coordination of care.    Brandy Hale, MD
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
## 2018-05-26 ENCOUNTER — Ambulatory Visit: Payer: Medicare Other | Admitting: Licensed Clinical Social Worker

## 2018-05-31 ENCOUNTER — Ambulatory Visit: Payer: Self-pay | Admitting: Nurse Practitioner

## 2018-06-08 ENCOUNTER — Ambulatory Visit: Payer: Medicare Other | Admitting: Licensed Clinical Social Worker

## 2018-06-15 ENCOUNTER — Ambulatory Visit: Payer: Self-pay | Admitting: Nurse Practitioner

## 2018-06-27 ENCOUNTER — Ambulatory Visit: Payer: Medicare Other | Admitting: Psychiatry

## 2018-09-22 ENCOUNTER — Other Ambulatory Visit: Payer: Self-pay

## 2018-09-22 ENCOUNTER — Emergency Department: Payer: Medicare Other

## 2018-09-22 ENCOUNTER — Emergency Department
Admission: EM | Admit: 2018-09-22 | Discharge: 2018-09-22 | Disposition: A | Discharge status: Home / Self Care | Payer: Medicare Other | Attending: Emergency Medicine | Admitting: Emergency Medicine

## 2018-09-22 DIAGNOSIS — K648 Other hemorrhoids: Secondary | ICD-10-CM | POA: Diagnosis not present

## 2018-09-22 DIAGNOSIS — I129 Hypertensive chronic kidney disease with stage 1 through stage 4 chronic kidney disease, or unspecified chronic kidney disease: Secondary | ICD-10-CM | POA: Diagnosis not present

## 2018-09-22 DIAGNOSIS — R110 Nausea: Secondary | ICD-10-CM | POA: Diagnosis not present

## 2018-09-22 DIAGNOSIS — K7200 Acute and subacute hepatic failure without coma: Secondary | ICD-10-CM | POA: Diagnosis not present

## 2018-09-22 DIAGNOSIS — Z66 Do not resuscitate: Secondary | ICD-10-CM | POA: Diagnosis not present

## 2018-09-22 DIAGNOSIS — E872 Acidosis: Secondary | ICD-10-CM | POA: Diagnosis not present

## 2018-09-22 DIAGNOSIS — R58 Hemorrhage, not elsewhere classified: Secondary | ICD-10-CM | POA: Diagnosis not present

## 2018-09-22 DIAGNOSIS — N179 Acute kidney failure, unspecified: Secondary | ICD-10-CM | POA: Insufficient documentation

## 2018-09-22 DIAGNOSIS — K644 Residual hemorrhoidal skin tags: Secondary | ICD-10-CM | POA: Diagnosis not present

## 2018-09-22 DIAGNOSIS — R197 Diarrhea, unspecified: Secondary | ICD-10-CM | POA: Diagnosis not present

## 2018-09-22 DIAGNOSIS — E873 Alkalosis: Secondary | ICD-10-CM | POA: Diagnosis not present

## 2018-09-22 DIAGNOSIS — D61818 Other pancytopenia: Secondary | ICD-10-CM | POA: Diagnosis not present

## 2018-09-22 DIAGNOSIS — R51 Headache: Secondary | ICD-10-CM | POA: Diagnosis not present

## 2018-09-22 DIAGNOSIS — R791 Abnormal coagulation profile: Secondary | ICD-10-CM | POA: Diagnosis not present

## 2018-09-22 DIAGNOSIS — I959 Hypotension, unspecified: Secondary | ICD-10-CM | POA: Diagnosis not present

## 2018-09-22 DIAGNOSIS — I340 Nonrheumatic mitral (valve) insufficiency: Secondary | ICD-10-CM | POA: Diagnosis not present

## 2018-09-22 DIAGNOSIS — K3189 Other diseases of stomach and duodenum: Secondary | ICD-10-CM | POA: Diagnosis not present

## 2018-09-22 DIAGNOSIS — Z79899 Other long term (current) drug therapy: Secondary | ICD-10-CM | POA: Insufficient documentation

## 2018-09-22 DIAGNOSIS — R05 Cough: Secondary | ICD-10-CM | POA: Diagnosis not present

## 2018-09-22 DIAGNOSIS — R17 Unspecified jaundice: Secondary | ICD-10-CM | POA: Insufficient documentation

## 2018-09-22 DIAGNOSIS — Z20828 Contact with and (suspected) exposure to other viral communicable diseases: Secondary | ICD-10-CM | POA: Insufficient documentation

## 2018-09-22 DIAGNOSIS — N189 Chronic kidney disease, unspecified: Secondary | ICD-10-CM | POA: Insufficient documentation

## 2018-09-22 DIAGNOSIS — E876 Hypokalemia: Secondary | ICD-10-CM | POA: Diagnosis not present

## 2018-09-22 DIAGNOSIS — I341 Nonrheumatic mitral (valve) prolapse: Secondary | ICD-10-CM | POA: Diagnosis not present

## 2018-09-22 DIAGNOSIS — K625 Hemorrhage of anus and rectum: Secondary | ICD-10-CM | POA: Diagnosis not present

## 2018-09-22 DIAGNOSIS — R160 Hepatomegaly, not elsewhere classified: Secondary | ICD-10-CM | POA: Diagnosis not present

## 2018-09-22 DIAGNOSIS — D649 Anemia, unspecified: Secondary | ICD-10-CM | POA: Diagnosis not present

## 2018-09-22 DIAGNOSIS — K7290 Hepatic failure, unspecified without coma: Secondary | ICD-10-CM | POA: Diagnosis not present

## 2018-09-22 DIAGNOSIS — K5730 Diverticulosis of large intestine without perforation or abscess without bleeding: Secondary | ICD-10-CM | POA: Diagnosis not present

## 2018-09-22 DIAGNOSIS — Z1159 Encounter for screening for other viral diseases: Secondary | ICD-10-CM | POA: Diagnosis not present

## 2018-09-22 DIAGNOSIS — E8809 Other disorders of plasma-protein metabolism, not elsewhere classified: Secondary | ICD-10-CM | POA: Diagnosis not present

## 2018-09-22 DIAGNOSIS — R112 Nausea with vomiting, unspecified: Secondary | ICD-10-CM | POA: Diagnosis not present

## 2018-09-22 DIAGNOSIS — R000 Tachycardia, unspecified: Secondary | ICD-10-CM | POA: Diagnosis not present

## 2018-09-22 DIAGNOSIS — I517 Cardiomegaly: Secondary | ICD-10-CM | POA: Diagnosis not present

## 2018-09-22 DIAGNOSIS — D539 Nutritional anemia, unspecified: Secondary | ICD-10-CM | POA: Diagnosis not present

## 2018-09-22 DIAGNOSIS — R932 Abnormal findings on diagnostic imaging of liver and biliary tract: Secondary | ICD-10-CM | POA: Diagnosis not present

## 2018-09-22 DIAGNOSIS — K8020 Calculus of gallbladder without cholecystitis without obstruction: Secondary | ICD-10-CM | POA: Diagnosis not present

## 2018-09-22 DIAGNOSIS — D62 Acute posthemorrhagic anemia: Secondary | ICD-10-CM | POA: Diagnosis not present

## 2018-09-22 DIAGNOSIS — D689 Coagulation defect, unspecified: Secondary | ICD-10-CM | POA: Diagnosis not present

## 2018-09-22 DIAGNOSIS — K760 Fatty (change of) liver, not elsewhere classified: Secondary | ICD-10-CM | POA: Diagnosis not present

## 2018-09-22 DIAGNOSIS — I420 Dilated cardiomyopathy: Secondary | ICD-10-CM | POA: Diagnosis not present

## 2018-09-22 DIAGNOSIS — E870 Hyperosmolality and hypernatremia: Secondary | ICD-10-CM | POA: Diagnosis not present

## 2018-09-22 DIAGNOSIS — K921 Melena: Secondary | ICD-10-CM | POA: Diagnosis not present

## 2018-09-22 DIAGNOSIS — K922 Gastrointestinal hemorrhage, unspecified: Secondary | ICD-10-CM | POA: Diagnosis not present

## 2018-09-22 LAB — COMPREHENSIVE METABOLIC PANEL
ALT: 102 U/L — ABNORMAL HIGH (ref 0–44)
AST: 358 U/L — ABNORMAL HIGH (ref 15–41)
Albumin: 1.8 g/dL — ABNORMAL LOW (ref 3.5–5.0)
Alkaline Phosphatase: 199 U/L — ABNORMAL HIGH (ref 38–126)
Anion gap: 18 — ABNORMAL HIGH (ref 5–15)
BUN: 8 mg/dL (ref 6–20)
CO2: 23 mmol/L (ref 22–32)
Calcium: 7.4 mg/dL — ABNORMAL LOW (ref 8.9–10.3)
Chloride: 94 mmol/L — ABNORMAL LOW (ref 98–111)
Creatinine, Ser: 2.87 mg/dL — ABNORMAL HIGH (ref 0.44–1.00)
GFR calc Af Amer: 21 mL/min — ABNORMAL LOW (ref >60)
GFR calc non Af Amer: 18 mL/min — ABNORMAL LOW (ref >60)
Glucose, Bld: 103 mg/dL — ABNORMAL HIGH (ref 70–99)
Potassium: 3.1 mmol/L — ABNORMAL LOW (ref 3.5–5.1)
Sodium: 135 mmol/L (ref 135–145)
Total Bilirubin: 15.7 mg/dL — ABNORMAL HIGH (ref 0.3–1.2)
Total Protein: 4.4 g/dL — ABNORMAL LOW (ref 6.5–8.1)

## 2018-09-22 LAB — CBC WITH DIFFERENTIAL/PLATELET
Abs Immature Granulocytes: 0.08 10*3/uL — ABNORMAL HIGH (ref 0.00–0.07)
Basophils Absolute: 0 10*3/uL (ref 0.0–0.1)
Basophils Relative: 0 %
Eosinophils Absolute: 0 10*3/uL (ref 0.0–0.5)
Eosinophils Relative: 0 %
HCT: 27 % — ABNORMAL LOW (ref 36.0–46.0)
Hemoglobin: 9.2 g/dL — ABNORMAL LOW (ref 12.0–15.0)
Immature Granulocytes: 1 %
Lymphocytes Relative: 26 %
Lymphs Abs: 2.2 10*3/uL (ref 0.7–4.0)
MCH: 38.3 pg — ABNORMAL HIGH (ref 26.0–34.0)
MCHC: 34.1 g/dL (ref 30.0–36.0)
MCV: 112.5 fL — ABNORMAL HIGH (ref 80.0–100.0)
Monocytes Absolute: 1.6 10*3/uL — ABNORMAL HIGH (ref 0.1–1.0)
Monocytes Relative: 19 %
Neutro Abs: 4.4 10*3/uL (ref 1.7–7.7)
Neutrophils Relative %: 54 %
Platelets: 159 10*3/uL (ref 150–400)
RBC: 2.4 MIL/uL — ABNORMAL LOW (ref 3.87–5.11)
RDW: 17 % — ABNORMAL HIGH (ref 11.5–15.5)
Smear Review: NORMAL
WBC: 8.3 10*3/uL (ref 4.0–10.5)
nRBC: 0.8 % — ABNORMAL HIGH (ref 0.0–0.2)

## 2018-09-22 LAB — TYPE AND SCREEN
ABO/RH(D): A NEG
Antibody Screen: NEGATIVE

## 2018-09-22 LAB — AMMONIA: Ammonia: 62 umol/L — ABNORMAL HIGH (ref 9–35)

## 2018-09-22 LAB — URINE DRUG SCREEN, QUALITATIVE (ARMC ONLY)
Amphetamines, Ur Screen: NOT DETECTED
Barbiturates, Ur Screen: NOT DETECTED
Benzodiazepine, Ur Scrn: NOT DETECTED
Cannabinoid 50 Ng, Ur ~~LOC~~: NOT DETECTED
Cocaine Metabolite,Ur ~~LOC~~: NOT DETECTED
MDMA (Ecstasy)Ur Screen: NOT DETECTED
Methadone Scn, Ur: NOT DETECTED
Opiate, Ur Screen: NOT DETECTED
Phencyclidine (PCP) Ur S: NOT DETECTED
Tricyclic, Ur Screen: NOT DETECTED

## 2018-09-22 LAB — ACETAMINOPHEN LEVEL: Acetaminophen (Tylenol), Serum: <10 ug/mL — ABNORMAL LOW (ref 10–30)

## 2018-09-22 LAB — APTT: aPTT: 61 seconds — ABNORMAL HIGH (ref 24–36)

## 2018-09-22 LAB — PROTIME-INR
INR: 2.2 — ABNORMAL HIGH (ref 0.8–1.2)
Prothrombin Time: 23.8 seconds — ABNORMAL HIGH (ref 11.4–15.2)

## 2018-09-22 LAB — SARS CORONAVIRUS 2 BY RT PCR (HOSPITAL ORDER, PERFORMED IN ~~LOC~~ HOSPITAL LAB): SARS Coronavirus 2: NEGATIVE

## 2018-09-22 LAB — LIPASE, BLOOD: Lipase: 44 U/L (ref 11–51)

## 2018-09-22 LAB — ETHANOL: Alcohol, Ethyl (B): <10 mg/dL (ref <10)

## 2018-09-22 MED ORDER — SODIUM CHLORIDE 0.9 % IV BOLUS
2000.0000 mL | Freq: Once | INTRAVENOUS | Status: AC
  Administered 2018-09-22: 06:00:00 2000 mL via INTRAVENOUS

## 2018-09-22 MED ORDER — ONDANSETRON HCL 4 MG/2ML IJ SOLN
4.0000 mg | Freq: Once | INTRAMUSCULAR | Status: AC
  Administered 2018-09-22: 06:00:00 4 mg via INTRAVENOUS
  Filled 2018-09-22: qty 2

## 2018-09-22 MED ORDER — SODIUM CHLORIDE 0.9 % IV BOLUS
1000.0000 mL | Freq: Once | INTRAVENOUS | Status: AC
  Administered 2018-09-22: 1000 mL via INTRAVENOUS

## 2018-09-22 MED ORDER — DEXTROSE 5 % IV SOLN
15.0000 mg/kg/h | INTRAVENOUS | Status: DC
  Administered 2018-09-22: 12:00:00 15 mg/kg/h via INTRAVENOUS
  Filled 2018-09-22: qty 200

## 2018-09-22 MED ORDER — ACETYLCYSTEINE LOAD VIA INFUSION
150.0000 mg/kg | Freq: Once | INTRAVENOUS | Status: AC
  Administered 2018-09-22: 12:00:00 9930 mg via INTRAVENOUS
  Filled 2018-09-22: qty 249

## 2018-09-22 NOTE — ED Notes (Signed)
Patient transported to CT 

## 2018-09-22 NOTE — Consult Note (Signed)
PHARMACY - CRITICAL CARE PROGRESS NOTE  Pharmacy Consult for Acetylcysteine therapy Indication: Acetaminophen Overdose   No Known Allergies  Patient Measurements: Height: 5\' 3"  (160 cm) Weight: 146 lb (66.2 kg) IBW/kg (Calculated) : 52.4 Adjusted Body Weight: N/A  Vital Signs: Temp: 98.6 F (37 C) (06/04 0558) Temp Source: Oral (06/04 0558) BP: 94/56 (06/04 1315) Pulse Rate: 116 (06/04 1315) Intake/Output from previous day: No intake/output data recorded. Intake/Output from this shift: Total I/O In: 3000 [IV Piggyback:3000] Out: -  Vent settings for last 24 hours:    Labs: Recent Labs    09/22/18 0602 09/22/18 0630  WBC 8.3  --   HGB 9.2*  --   HCT 27.0*  --   PLT 159  --   APTT  --  61*  INR  --  2.2*  CREATININE  --  2.87*  ALBUMIN  --  1.8*  PROT  --  4.4*  AST  --  358*  ALT  --  102*  ALKPHOS  --  199*  BILITOT  --  15.7*   Estimated Creatinine Clearance: 20.5 mL/min (A) (by C-G formula based on SCr of 2.87 mg/dL (H)).  No results for input(s): GLUCAP in the last 72 hours.  Microbiology: Recent Results (from the past 720 hour(s))  SARS Coronavirus 2 (CEPHEID - Performed in [facility] hospital lab), Hosp Order     Status: None   Collection Time: 09/22/18  6:22 AM  Result Value Ref Range Status   SARS Coronavirus 2 NEGATIVE NEGATIVE Final    Comment: (NOTE) If result is NEGATIVE SARS-CoV-2 target nucleic acids are NOT DETECTED. The SARS-CoV-2 RNA is generally detectable in upper and lower  respiratory specimens during the acute phase of infection. The lowest  concentration of SARS-CoV-2 viral copies this assay can detect is 250  copies / mL. A negative result does not preclude SARS-CoV-2 infection  and should not be used as the sole basis for treatment or other  patient management decisions.  A negative result may occur with  improper specimen collection / handling, submission of specimen other  than nasopharyngeal swab, presence of viral  mutation(s) within the  areas targeted by this assay, and inadequate number of viral copies  (<250 copies / mL). A negative result must be combined with clinical  observations, patient history, and epidemiological information. If result is POSITIVE SARS-CoV-2 target nucleic acids are DETECTED. The SARS-CoV-2 RNA is generally detectable in upper and lower  respiratory specimens dur ing the acute phase of infection.  Positive  results are indicative of active infection with SARS-CoV-2.  Clinical  correlation with patient history and other diagnostic information is  necessary to determine patient infection status.  Positive results do  not rule out bacterial infection or co-infection with other viruses. If result is PRESUMPTIVE POSTIVE SARS-CoV-2 nucleic acids MAY BE PRESENT.   A presumptive positive result was obtained on the submitted specimen  and confirmed on repeat testing.  While 2019 novel coronavirus  (SARS-CoV-2) nucleic acids may be present in the submitted sample  additional confirmatory testing may be necessary for epidemiological  and / or clinical management purposes  to differentiate between  SARS-CoV-2 and other Sarbecovirus currently known to infect humans.  If clinically indicated additional testing with an alternate test  methodology [phone]) is advised. The SARS-CoV-2 RNA is generally  detectable in upper and lower respiratory sp ecimens during the acute  phase of infection. The expected result is Negative. Fact Sheet for Patients:  BoilerBrush.com.cy Fact  Sheet for Healthcare Providers: https://pope.com/https://www.fda.gov/media/136313/download This test is not yet approved or cleared by the [place] FDA and has been authorized for detection and/or diagnosis of SARS-CoV-2 by FDA under an Emergency Use Authorization (EUA).  This EUA will remain in effect (meaning this test can be used) for the duration of the COVID-19 declaration under Section 564(b)(1)  of the Act, 21 U.S.C. section 360bbb-3(b)(1), unless the authorization is terminated or revoked sooner. Performed at [facility], [address] [place], [place] [address]     Medications:  (Not in a hospital admission)  Scheduled:   Infusions:  . acetylcysteine 15 mg/kg/hr (09/22/18 1209)   PRN:   Assessment: Patient presented to ED with nausea and vomiting due to potential Acetaminophen overdose. Per MD notes, patient has been taking 8-10 extra strength tylenol since as long as she can remember. ALT lvl: 102, AST lvl: 358. Acetaminophen level was less than 10 ug/mL.  Goal of Therapy:  Prevention of further health complications  Plan:  [place] [facility] was contacted and recommended loading infusion dose of Acetylcysteine 40mg /ml IV of 150mg /kg x 1 hour followed by a continuous infusion of 15mg /kg x 23 hours. [facility].  Bettey CostaWalid A Aarian Griffie, PharmD Clinical Pharmacist 09/22/2018 1:56 PM

## 2018-09-22 NOTE — ED Provider Notes (Signed)
Patient appears to be meeting criteria relates to some degree for liver failure.  Patient states she does take 8-10 extra strength Tylenols a day and she has done this for as long as she can remember which is likely of the problem.  I will discuss with the [facility] transfer center.   Emily Filbert, MD 09/22/18 [phone]

## 2018-09-22 NOTE — ED Notes (Signed)
EMTALA reviewed by charge RN 

## 2018-09-22 NOTE — ED Provider Notes (Signed)
[facility] Emergency Department Provider Note  ____________________________________________  Time seen: Approximately 6:04 AM  I have reviewed the triage vital signs and the nursing notes.   HISTORY  Chief Complaint Hematuria; Rectal Bleeding; and Emesis   HPI Shelley [name] is a 53 y.o. female with history of chronic kidney disease, bipolar disorder, hypertension, anxiety who presents for evaluation of vomiting.  Patient reports a week of dry cough, several daily episodes of nonbloody nonbilious emesis.  She has been unable to keep anything down.  She presents this morning because her husband called 911 since she was not getting better.  She has had no fever or chills, no diarrhea or constipation, no abdominal pain, no CP or SOB.  Patient is jaundiced but had not noticed that.  She denies night sweats, unintentional weight loss, family history of liver or pancreatic cancer.  Patient also has noticed rectal bleeding for the last 4 days.  Has had a few episodes a day.  No coffee-ground emesis, no melena.  She is not on blood thinners.  Past Medical History:  Diagnosis Date   Anxiety    Bipolar 1 disorder (HCC)    Chronic kidney disease    Hypertension    PTSD (post-traumatic stress disorder)     Patient Active Problem List   Diagnosis Date Noted   Bipolar disorder (HCC) 07/11/2015   PTSD (post-traumatic stress disorder) 07/11/2015   Generalized anxiety disorder 07/11/2015   Dyspnea 05/09/2014   Obesity 05/09/2014    Past Surgical History:  Procedure Laterality Date   BLADDER SURGERY     none      Prior to Admission medications   Medication Sig Start Date End Date Taking? Authorizing Provider  Ascorbic Acid (VITAMIN C) 500 MG CAPS  10/09/17   [provider]  B Complex Vitamins (VITAMIN B COMPLEX PO)  10/09/17   [provider]  b complex vitamins capsule  08/06/15   [provider]  cholecalciferol (VITAMIN  D) 1000 units tablet Take 1,000 Units by mouth daily.    [provider]  cholecalciferol (VITAMIN D) 1000 units tablet  10/09/17   [provider]  escitalopram (LEXAPRO) 10 MG tablet Take 1 tablet (10 mg total) by mouth daily. 04/25/18   Brandy HaleFaheem, Uzma, MD  lamoTRIgine (LAMICTAL) 150 MG tablet Take 1 tablet (150 mg total) by mouth daily. 04/25/18   Brandy HaleFaheem, Uzma, MD  Magnesium 250 MG TABS Take by mouth.    [provider]  Multiple Vitamin (MULTI-VITAMINS) TABS Take by mouth.    [provider]  MULTIPLE VITAMINS PO  10/18/17   [provider]  prazosin (MINIPRESS) 5 MG capsule Take 1 capsule (5 mg total) by mouth at bedtime. 04/25/18   Brandy HaleFaheem, Uzma, MD  topiramate (TOPAMAX) 50 MG tablet Take 1 tablet (50 mg total) by mouth daily. 04/25/18   Brandy HaleFaheem, Uzma, MD  traZODone (DESYREL) 100 MG tablet Take 1 tablet (100 mg total) by mouth at bedtime. 04/25/18   Brandy HaleFaheem, Uzma, MD    Allergies Patient has no known allergies.  Family History  Problem Relation Age of Onset   Cancer Mother    Alcohol abuse Mother    Heart attack Father    Cancer Father    Depression Sister    Hypertension Brother    Diabetes Brother    Drug abuse Sister    Alcohol abuse Sister    Obesity Sister    Anxiety disorder Sister    Depression Sister  Hypertension Brother    Depression Brother    Alcohol abuse Brother     Social History Social History   Tobacco Use   Smoking status: Never Smoker   Smokeless tobacco: Never Used  Substance Use Topics   Alcohol use: No    Alcohol/week: 0.0 standard drinks   Drug use: No    Review of Systems  Constitutional: Negative for fever. Eyes: Negative for visual changes. ENT: Negative for sore throat. Neck: No neck pain  Cardiovascular: Negative for chest pain. Respiratory: Negative for shortness of breath. + cough Gastrointestinal: Negative for abdominal pain, diarrhea. + nausea and vomiting, rectal  bleeding Genitourinary: Negative for dysuria. Musculoskeletal: Negative for back pain. Skin: Negative for rash. + jaundice Neurological: Negative for headaches, weakness or numbness. Psych: No SI or HI  ____________________________________________   PHYSICAL EXAM:  VITAL SIGNS: ED Triage Vitals  Enc Vitals Group     BP 09/22/18 0558 92/66     Pulse Rate 09/22/18 0558 (!) 126     Resp 09/22/18 0558 17     Temp 09/22/18 0558 98.6 F (37 C)     Temp Source 09/22/18 0558 Oral     SpO2 09/22/18 0553 99 %     Weight 09/22/18 0558 146 lb (66.2 kg)     Height 09/22/18 0558  (1.6 m)     Head Circumference --      Peak Flow --      Pain Score 09/22/18 0558 8     Pain Loc --      Pain Edu? --      Excl. in GC? --     Constitutional: Alert and oriented, no distress, actively coughing.  HEENT:      Head: Normocephalic and atraumatic.         Eyes: Conjunctivae are normal. Sclera is icteric.       Mouth/Throat: Mucous membranes are moist.       Neck: Supple with no signs of meningismus. Cardiovascular: Tachycardic with regular. No murmurs, gallops, or rubs. 2+ symmetrical distal pulses are present in all extremities. No JVD. Respiratory: Normal respiratory effort. Lungs are clear to auscultation bilaterally. No wheezes, crackles, or rhonchi.  Gastrointestinal: Soft, non tender, and non distended with positive bowel sounds. No rebound or guarding. Musculoskeletal: Nontender with normal range of motion in all extremities. No edema, cyanosis, or erythema of extremities.  Neurologic: Normal speech and language. Face is symmetric. Moving all extremities. No gross focal neurologic deficits are appreciated. Skin: Skin is warm, dry and intact. Jaundice mostly in the face Psychiatric: Mood and affect are normal. Speech and behavior are normal.  ____________________________________________   LABS (all labs ordered are listed, but only abnormal results are displayed)  Labs Reviewed   CBC WITH DIFFERENTIAL/PLATELET - Abnormal; Notable for the following components:      Result Value   RBC 2.40 (*)    Hemoglobin 9.2 (*)    HCT 27.0 (*)    MCV 112.5 (*)    MCH 38.3 (*)    RDW 17.0 (*)    nRBC 0.8 (*)    Monocytes Absolute 1.6 (*)    Abs Immature Granulocytes 0.08 (*)    All other components within normal limits  COMPREHENSIVE METABOLIC PANEL - Abnormal; Notable for the following components:   Potassium 3.1 (*)    Chloride 94 (*)    Glucose, Bld 103 (*)    Creatinine, Ser 2.87 (*)    Calcium 7.4 (*)    Total  Protein 4.4 (*)    Albumin 1.8 (*)    AST 358 (*)    ALT 102 (*)    Alkaline Phosphatase 199 (*)    Total Bilirubin 15.7 (*)    GFR calc non Af Amer 18 (*)    GFR calc Af Amer 21 (*)    Anion gap 18 (*)    All other components within normal limits  PROTIME-INR - Abnormal; Notable for the following components:   Prothrombin Time 23.8 (*)    INR 2.2 (*)    All other components within normal limits  APTT - Abnormal; Notable for the following components:   aPTT 61 (*)    All other components within normal limits  SARS CORONAVIRUS 2 (HOSPITAL ORDER, PERFORMED IN [facility])  LIPASE, BLOOD  TYPE AND SCREEN   ____________________________________________  EKG  ED ECG REPORT I, Nita Sickle, the attending physician, personally viewed and interpreted this ECG.  Sinus tachycardia, rate of 129, normal intervals, normal axis, no ST elevations or depressions. ____________________________________________  RADIOLOGY  I have personally reviewed the images performed during this visit and I agree with the Radiologist's read.   Interpretation by Radiologist:  Dg Chest Portable 1 View  Result Date: 09/22/2018 CLINICAL DATA:  Cough EXAM: PORTABLE CHEST 1 VIEW COMPARISON:  04/24/2010 FINDINGS: New elevation of the right diaphragm. There is no edema, consolidation, effusion, or pneumothorax. Normal heart size and mediastinal contours. No acute  osseous finding. IMPRESSION: 1. No evidence of acute cardiopulmonary disease. 2. New mild right diaphragm elevation, suggest abdominal imaging given history of jaundice. Electronically Signed   By: Marnee Spring M.D.   On: 09/22/2018 06:35      ____________________________________________   PROCEDURES  Procedure(s) performed: None Procedures Critical Care performed: yes  CRITICAL CARE Performed by: Nita Sickle  ?  Total critical care time: 35 min  Critical care time was exclusive of separately billable procedures and treating other patients.  Critical care was necessary to treat or prevent imminent or life-threatening deterioration.  Critical care was time spent personally by me on the following activities: development of treatment plan with patient and/or surrogate as well as nursing, discussions with consultants, evaluation of patient's response to treatment, examination of patient, obtaining history from patient or surrogate, ordering and performing treatments and interventions, ordering and review of laboratory studies, ordering and review of radiographic studies, pulse oximetry and re-evaluation of patient's condition.  ____________________________________________   INITIAL IMPRESSION / ASSESSMENT AND PLAN / ED COURSE  53 y.o. female with history of chronic kidney disease, bipolar disorder, hypertension, anxiety who presents for evaluation of vomiting, cough, rectal bleeding x 1 week. Patient is actively coughing but in no respiratory distress with normal sats, she is hypotensive and tachycardic, she is afebrile, she has scleral icterus and jaundice of the head and neck.  Abdomen is soft with no palpable masses or tenderness.  Differential diagnosis including common bile duct obstruction by stone versus mass versus liver cancer versus pancreatic cancer. Patient with no history of drinking. Not vomiting blood. CXR ordered to eval for edema vs PNA. Labs pending. Will start  resuscitating patient with aggressive hydration, will give Zofran for nausea.  Anticipate admission    _________________________ 7:09 AM on 09/22/2018 -----------------------------------------  Labs showing AST of 358 with ALT of 102, T bili of 15, anion gap of 18, creatinine of 2.87, INR is elevated at 2.2, hemoglobin showing new anemia of 9.2.  CT abdomen pelvis is pending.  Patient's blood pressure  and heart rate are improving with IV fluids.  Type and cross active.  Care transferred to Dr. Mayford Knife.   As part of my medical decision making, I reviewed the following data within the electronic MEDICAL RECORD NUMBER Nursing notes reviewed and incorporated, Labs reviewed , EKG interpreted , Old chart reviewed, Radiograph reviewed , Notes from prior ED visits and [place] Controlled Substance Database    Pertinent labs & imaging results that were available during my care of the patient were reviewed by me and considered in my medical decision making (see chart for details).    ____________________________________________   FINAL CLINICAL IMPRESSION(S) / ED DIAGNOSES  Final diagnoses:  Nausea and vomiting, intractability of vomiting not specified, unspecified vomiting type  Rectal bleeding  Elevated INR  Anemia, unspecified type  Tachycardia  Acute kidney injury superimposed on chronic kidney disease (HCC)      NEW MEDICATIONS STARTED DURING THIS VISIT:  ED Discharge Orders    None       Note:  This document was prepared using Dragon voice recognition software and may include unintentional dictation errors.    Don Perking, [place], MD 09/22/18 [phone]

## 2018-09-22 NOTE — ED Notes (Signed)
Pt resting quietly on stretcher. Tearfully speaking on the phone. Iv fluids infusing without difficulty. Pt reports she currently has "lung and back pain from coughing so much". Provided for pt safety and comfort and will continue to assess.

## 2018-09-22 NOTE — ED Triage Notes (Signed)
Pt arrives to ED via [facility] from home with c/o N/V x1 week, cough x2 weeks, and rectal bleeding since Monday. Pt arrives with yellow sclera and appears very jaundiced (no h/x of liver or kidney disease). Pt denies CP, but reports SHOB at rest and exertion. Pt is A&O, in NAD; RR even, regular, and unlabored. Dr Don Perking at bedside upon pt's arrival to ED.

## 2018-09-22 NOTE — ED Provider Notes (Signed)
Patient has been accepted in transfer to [facility], I discussed with hepatology.  Accepting doctor is Dr. Jacolyn Reedy, Cecille [place], MD 09/22/18 [phone]

## 2018-09-23 LAB — HEPATITIS PANEL, ACUTE
HCV Ab: <0.1 s/co ratio (ref 0.0–0.9)
Hep A IgM: NEGATIVE
Hep B C IgM: NEGATIVE
Hepatitis B Surface Ag: NEGATIVE

## 2018-10-05 DIAGNOSIS — R531 Weakness: Secondary | ICD-10-CM | POA: Diagnosis not present

## 2018-10-05 DIAGNOSIS — R2689 Other abnormalities of gait and mobility: Secondary | ICD-10-CM | POA: Diagnosis not present

## 2018-10-07 DIAGNOSIS — I1310 Hypertensive heart and chronic kidney disease without heart failure, with stage 1 through stage 4 chronic kidney disease, or unspecified chronic kidney disease: Secondary | ICD-10-CM | POA: Diagnosis not present

## 2018-10-07 DIAGNOSIS — K648 Other hemorrhoids: Secondary | ICD-10-CM | POA: Diagnosis not present

## 2018-10-07 DIAGNOSIS — K8020 Calculus of gallbladder without cholecystitis without obstruction: Secondary | ICD-10-CM | POA: Diagnosis not present

## 2018-10-07 DIAGNOSIS — N189 Chronic kidney disease, unspecified: Secondary | ICD-10-CM | POA: Diagnosis not present

## 2018-10-14 DIAGNOSIS — I1310 Hypertensive heart and chronic kidney disease without heart failure, with stage 1 through stage 4 chronic kidney disease, or unspecified chronic kidney disease: Secondary | ICD-10-CM | POA: Diagnosis not present

## 2018-10-14 DIAGNOSIS — K8020 Calculus of gallbladder without cholecystitis without obstruction: Secondary | ICD-10-CM | POA: Diagnosis not present

## 2018-10-14 DIAGNOSIS — N189 Chronic kidney disease, unspecified: Secondary | ICD-10-CM | POA: Diagnosis not present

## 2018-10-14 DIAGNOSIS — K648 Other hemorrhoids: Secondary | ICD-10-CM | POA: Diagnosis not present

## 2018-10-17 DIAGNOSIS — I1310 Hypertensive heart and chronic kidney disease without heart failure, with stage 1 through stage 4 chronic kidney disease, or unspecified chronic kidney disease: Secondary | ICD-10-CM | POA: Diagnosis not present

## 2018-10-17 DIAGNOSIS — K648 Other hemorrhoids: Secondary | ICD-10-CM | POA: Diagnosis not present

## 2018-10-17 DIAGNOSIS — N189 Chronic kidney disease, unspecified: Secondary | ICD-10-CM | POA: Diagnosis not present

## 2018-10-17 DIAGNOSIS — K8020 Calculus of gallbladder without cholecystitis without obstruction: Secondary | ICD-10-CM | POA: Diagnosis not present

## 2018-10-19 ENCOUNTER — Encounter: Payer: Self-pay | Admitting: Emergency Medicine

## 2018-10-19 ENCOUNTER — Other Ambulatory Visit: Payer: Self-pay

## 2018-10-19 ENCOUNTER — Emergency Department: Payer: Medicare Other

## 2018-10-19 ENCOUNTER — Inpatient Hospital Stay
Admission: EM | Admit: 2018-10-19 | Discharge: 2018-10-26 | DRG: 433 | Disposition: A | Discharge status: Hospice | Payer: Medicare Other | Attending: Internal Medicine | Admitting: Internal Medicine

## 2018-10-19 DIAGNOSIS — Z1159 Encounter for screening for other viral diseases: Secondary | ICD-10-CM

## 2018-10-19 DIAGNOSIS — G9341 Metabolic encephalopathy: Secondary | ICD-10-CM | POA: Diagnosis not present

## 2018-10-19 DIAGNOSIS — K7030 Alcoholic cirrhosis of liver without ascites: Secondary | ICD-10-CM | POA: Diagnosis not present

## 2018-10-19 DIAGNOSIS — K602 Anal fissure, unspecified: Secondary | ICD-10-CM | POA: Diagnosis not present

## 2018-10-19 DIAGNOSIS — N189 Chronic kidney disease, unspecified: Secondary | ICD-10-CM | POA: Diagnosis not present

## 2018-10-19 DIAGNOSIS — I129 Hypertensive chronic kidney disease with stage 1 through stage 4 chronic kidney disease, or unspecified chronic kidney disease: Secondary | ICD-10-CM | POA: Diagnosis present

## 2018-10-19 DIAGNOSIS — K2970 Gastritis, unspecified, without bleeding: Secondary | ICD-10-CM | POA: Diagnosis not present

## 2018-10-19 DIAGNOSIS — K922 Gastrointestinal hemorrhage, unspecified: Secondary | ICD-10-CM | POA: Diagnosis not present

## 2018-10-19 DIAGNOSIS — I959 Hypotension, unspecified: Secondary | ICD-10-CM | POA: Diagnosis not present

## 2018-10-19 DIAGNOSIS — K3189 Other diseases of stomach and duodenum: Secondary | ICD-10-CM | POA: Diagnosis not present

## 2018-10-19 DIAGNOSIS — K7460 Unspecified cirrhosis of liver: Secondary | ICD-10-CM

## 2018-10-19 DIAGNOSIS — F319 Bipolar disorder, unspecified: Secondary | ICD-10-CM | POA: Diagnosis present

## 2018-10-19 DIAGNOSIS — Z515 Encounter for palliative care: Secondary | ICD-10-CM | POA: Diagnosis not present

## 2018-10-19 DIAGNOSIS — K644 Residual hemorrhoidal skin tags: Secondary | ICD-10-CM | POA: Diagnosis present

## 2018-10-19 DIAGNOSIS — R000 Tachycardia, unspecified: Secondary | ICD-10-CM | POA: Diagnosis present

## 2018-10-19 DIAGNOSIS — Z811 Family history of alcohol abuse and dependence: Secondary | ICD-10-CM

## 2018-10-19 DIAGNOSIS — R0602 Shortness of breath: Secondary | ICD-10-CM

## 2018-10-19 DIAGNOSIS — K7010 Alcoholic hepatitis without ascites: Secondary | ICD-10-CM | POA: Diagnosis not present

## 2018-10-19 DIAGNOSIS — E876 Hypokalemia: Secondary | ICD-10-CM | POA: Diagnosis not present

## 2018-10-19 DIAGNOSIS — R17 Unspecified jaundice: Secondary | ICD-10-CM | POA: Diagnosis not present

## 2018-10-19 DIAGNOSIS — D61818 Other pancytopenia: Secondary | ICD-10-CM | POA: Diagnosis present

## 2018-10-19 DIAGNOSIS — Z7401 Bed confinement status: Secondary | ICD-10-CM | POA: Diagnosis not present

## 2018-10-19 DIAGNOSIS — Z7189 Other specified counseling: Secondary | ICD-10-CM

## 2018-10-19 DIAGNOSIS — K7011 Alcoholic hepatitis with ascites: Secondary | ICD-10-CM | POA: Diagnosis present

## 2018-10-19 DIAGNOSIS — Z66 Do not resuscitate: Secondary | ICD-10-CM | POA: Diagnosis not present

## 2018-10-19 DIAGNOSIS — K7040 Alcoholic hepatic failure without coma: Principal | ICD-10-CM | POA: Diagnosis present

## 2018-10-19 DIAGNOSIS — K625 Hemorrhage of anus and rectum: Secondary | ICD-10-CM

## 2018-10-19 DIAGNOSIS — I1310 Hypertensive heart and chronic kidney disease without heart failure, with stage 1 through stage 4 chronic kidney disease, or unspecified chronic kidney disease: Secondary | ICD-10-CM | POA: Diagnosis not present

## 2018-10-19 DIAGNOSIS — Z818 Family history of other mental and behavioral disorders: Secondary | ICD-10-CM

## 2018-10-19 DIAGNOSIS — R188 Other ascites: Secondary | ICD-10-CM | POA: Diagnosis not present

## 2018-10-19 DIAGNOSIS — F4310 Post-traumatic stress disorder, unspecified: Secondary | ICD-10-CM | POA: Diagnosis present

## 2018-10-19 DIAGNOSIS — K7581 Nonalcoholic steatohepatitis (NASH): Secondary | ICD-10-CM | POA: Diagnosis not present

## 2018-10-19 DIAGNOSIS — F1010 Alcohol abuse, uncomplicated: Secondary | ICD-10-CM | POA: Diagnosis present

## 2018-10-19 DIAGNOSIS — M2550 Pain in unspecified joint: Secondary | ICD-10-CM | POA: Diagnosis not present

## 2018-10-19 DIAGNOSIS — D689 Coagulation defect, unspecified: Secondary | ICD-10-CM | POA: Diagnosis present

## 2018-10-19 DIAGNOSIS — M6281 Muscle weakness (generalized): Secondary | ICD-10-CM | POA: Diagnosis not present

## 2018-10-19 DIAGNOSIS — R4182 Altered mental status, unspecified: Secondary | ICD-10-CM

## 2018-10-19 DIAGNOSIS — K640 First degree hemorrhoids: Secondary | ICD-10-CM | POA: Diagnosis present

## 2018-10-19 DIAGNOSIS — Z79899 Other long term (current) drug therapy: Secondary | ICD-10-CM

## 2018-10-19 DIAGNOSIS — K8020 Calculus of gallbladder without cholecystitis without obstruction: Secondary | ICD-10-CM | POA: Diagnosis not present

## 2018-10-19 DIAGNOSIS — F419 Anxiety disorder, unspecified: Secondary | ICD-10-CM | POA: Diagnosis present

## 2018-10-19 DIAGNOSIS — R404 Transient alteration of awareness: Secondary | ICD-10-CM | POA: Diagnosis not present

## 2018-10-19 DIAGNOSIS — I10 Essential (primary) hypertension: Secondary | ICD-10-CM | POA: Diagnosis not present

## 2018-10-19 DIAGNOSIS — K7031 Alcoholic cirrhosis of liver with ascites: Secondary | ICD-10-CM | POA: Diagnosis present

## 2018-10-19 DIAGNOSIS — K652 Spontaneous bacterial peritonitis: Secondary | ICD-10-CM

## 2018-10-19 DIAGNOSIS — R4020 Unspecified coma: Secondary | ICD-10-CM | POA: Diagnosis not present

## 2018-10-19 DIAGNOSIS — K7200 Acute and subacute hepatic failure without coma: Secondary | ICD-10-CM | POA: Diagnosis not present

## 2018-10-19 DIAGNOSIS — Z8249 Family history of ischemic heart disease and other diseases of the circulatory system: Secondary | ICD-10-CM

## 2018-10-19 DIAGNOSIS — K648 Other hemorrhoids: Secondary | ICD-10-CM | POA: Diagnosis not present

## 2018-10-19 DIAGNOSIS — K7290 Hepatic failure, unspecified without coma: Secondary | ICD-10-CM | POA: Diagnosis not present

## 2018-10-19 LAB — CBC WITH DIFFERENTIAL/PLATELET
Abs Immature Granulocytes: 0.03 10*3/uL (ref 0.00–0.07)
Basophils Absolute: 0 10*3/uL (ref 0.0–0.1)
Basophils Relative: 0 %
Eosinophils Absolute: 0.1 10*3/uL (ref 0.0–0.5)
Eosinophils Relative: 2 %
HCT: 26.5 % — ABNORMAL LOW (ref 36.0–46.0)
Hemoglobin: 9 g/dL — ABNORMAL LOW (ref 12.0–15.0)
Immature Granulocytes: 1 %
Lymphocytes Relative: 12 %
Lymphs Abs: 0.6 10*3/uL — ABNORMAL LOW (ref 0.7–4.0)
MCH: 33.7 pg (ref 26.0–34.0)
MCHC: 34 g/dL (ref 30.0–36.0)
MCV: 99.3 fL (ref 80.0–100.0)
Monocytes Absolute: 1 10*3/uL (ref 0.1–1.0)
Monocytes Relative: 20 %
Neutro Abs: 3.4 10*3/uL (ref 1.7–7.7)
Neutrophils Relative %: 65 %
Platelets: 252 10*3/uL (ref 150–400)
RBC: 2.67 MIL/uL — ABNORMAL LOW (ref 3.87–5.11)
RDW: 20.6 % — ABNORMAL HIGH (ref 11.5–15.5)
WBC: 5.1 10*3/uL (ref 4.0–10.5)
nRBC: 0 % (ref 0.0–0.2)

## 2018-10-19 LAB — SAMPLE TO BLOOD BANK

## 2018-10-19 LAB — PROTIME-INR
INR: 1.5 — ABNORMAL HIGH (ref 0.8–1.2)
Prothrombin Time: 17.5 seconds — ABNORMAL HIGH (ref 11.4–15.2)

## 2018-10-19 LAB — MAGNESIUM: Magnesium: 2 mg/dL (ref 1.7–2.4)

## 2018-10-19 LAB — SARS CORONAVIRUS 2 BY RT PCR (HOSPITAL ORDER, PERFORMED IN ~~LOC~~ HOSPITAL LAB): SARS Coronavirus 2: NEGATIVE

## 2018-10-19 MED ORDER — SODIUM CHLORIDE 0.9 % IV SOLN
80.0000 mg | Freq: Once | INTRAVENOUS | Status: AC
  Administered 2018-10-20: 01:00:00 80 mg via INTRAVENOUS
  Filled 2018-10-19: qty 80

## 2018-10-19 MED ORDER — SODIUM CHLORIDE 0.9 % IV SOLN
10.0000 mL/h | Freq: Once | INTRAVENOUS | Status: AC
  Administered 2018-10-20: 10 mL/h via INTRAVENOUS

## 2018-10-19 MED ORDER — POTASSIUM CHLORIDE 10 MEQ/100ML IV SOLN
10.0000 meq | Freq: Once | INTRAVENOUS | Status: AC
  Administered 2018-10-19: 23:00:00 10 meq via INTRAVENOUS
  Filled 2018-10-19: qty 100

## 2018-10-19 MED ORDER — SODIUM CHLORIDE 0.9 % IV SOLN
50.0000 ug/h | INTRAVENOUS | Status: DC
  Administered 2018-10-20 (×2): 50 ug/h via INTRAVENOUS
  Filled 2018-10-19 (×4): qty 1

## 2018-10-19 NOTE — H&P (Signed)
[employer] [facility] [place] [facility]    PATIENT NAME: Shelley [name]    MR#:  [id number]  DATE OF BIRTH:  11-12-1965  DATE OF ADMISSION:  10/19/2018  PRIMARY CARE PHYSICIAN[facility]   REQUESTING/REFERRING PHYSICIAN: Dr. Willy EddyPatrick Robinson.   CHIEF COMPLAINT:   Chief Complaint  Patient presents with   Rectal Bleeding   Jaundice    HISTORY OF PRESENT ILLNESS:  Shelley [name]  is a 53 y.o. female with a known history of bipolar disorder, anxiety, alcohol abuse, PTSD, hypertension recently diagnosed alcoholic liver cirrhosis who was hospitalized [facility] for about 2 and half to 3 weeks now returns back to the hospital today complaining of rectal bleeding.  I am unable to get the records from care everywhere unfortunately but patient was [facility] for worsening liver failure secondary to alcohol abuse and underwent an upper GI endoscopy which showed no esophageal varices but some gastritis and a colonoscopy which showed just some hemorrhoids although not able to access these records.  This is based off what the ER physician spoke to the hepatology team at [facility].  Patient now presents with worsening rectal bleeding and was noted to be slightly hypotensive but hemoglobin remained stable.  Hospitalist services were contacted for admission.  Patient was also noted to be severely hypokalemic with a potassium of 2.1.  PAST MEDICAL HISTORY:   Past Medical History:  Diagnosis Date   Anxiety    Bipolar 1 disorder (HCC)    Chronic kidney disease    Hypertension    PTSD (post-traumatic stress disorder)     PAST SURGICAL HISTORY:   Past Surgical History:  Procedure Laterality Date   BLADDER SURGERY     none      SOCIAL HISTORY:   Social History   Tobacco Use   Smoking status: Never Smoker   Smokeless tobacco: Never Used  Substance Use Topics   Alcohol use: No    Alcohol/week: 0.0 standard drinks    Frequency: Never    FAMILY  HISTORY:   Family History  Problem Relation Age of Onset   Cancer Mother    Alcohol abuse Mother    Heart attack Father    Cancer Father    Depression Sister    Hypertension Brother    Diabetes Brother    Drug abuse Sister    Alcohol abuse Sister    Obesity Sister    Anxiety disorder Sister    Depression Sister    Hypertension Brother    Depression Brother    Alcohol abuse Brother     DRUG ALLERGIES:  No Known Allergies  REVIEW OF SYSTEMS:   Review of Systems  Constitutional: Negative for fever and weight loss.  HENT: Negative for congestion, nosebleeds and tinnitus.   Eyes: Negative for blurred vision, double vision and redness.  Respiratory: Negative for cough, hemoptysis and shortness of breath.   Cardiovascular: Negative for chest pain, orthopnea, leg swelling and PND.  Gastrointestinal: Positive for blood in stool. Negative for abdominal pain, diarrhea, melena, nausea and vomiting.  Genitourinary: Negative for dysuria, hematuria and urgency.  Musculoskeletal: Negative for falls and joint pain.  Neurological: Positive for weakness (generalized). Negative for dizziness, tingling, sensory change, focal weakness, seizures and headaches.  Endo/Heme/Allergies: Negative for polydipsia. Does not bruise/bleed easily.  Psychiatric/Behavioral: Negative for depression and memory loss. The patient is not nervous/anxious.     MEDICATIONS AT HOME:   Prior to Admission medications   Medication Sig Start  Date End Date Taking? Authorizing Provider  lactulose (CHRONULAC) 10 GM/15ML solution TAKE 30 ML BY MOUTH TWICE DAILY 10/06/18  Yes [provider]  Thiamine HCl (VITAMIN B1) 100 MG TABS Take 1 tablet by mouth daily. 10/07/18  Yes [provider]  traZODone (DESYREL) 100 MG tablet Take 1 tablet (100 mg total) by mouth at bedtime. 04/25/18  Yes Rainey Pines, MD      VITAL SIGNS:  Blood pressure (!) 81/42, pulse 99, temperature 98.9 F (37.2 C),  resp. rate (!) 29, height 5\' 3"  (1.6 m), weight 66.2 kg, SpO2 100 %.  PHYSICAL EXAMINATION:  Physical Exam  GENERAL:  53 y.o.-year-old jaundiced patient lying in the bed with no acute distress.  EYES: Pupils equal, round, reactive to light and accommodation.+ scleral icterus. Extraocular muscles intact.  HEENT: Head atraumatic, normocephalic. Oropharynx and nasopharynx clear. No oropharyngeal erythema, moist oral mucosa  NECK:  Supple, no jugular venous distention. No thyroid enlargement, no tenderness.  LUNGS: Normal breath sounds bilaterally, no wheezing, rales, rhonchi. No use of accessory muscles of respiration.  CARDIOVASCULAR: S1, S2 RRR. No murmurs, rubs, gallops, clicks.  ABDOMEN: Soft, nontender, nondistended. Bowel sounds present. No organomegaly or mass.  EXTREMITIES: No pedal edema, cyanosis, or clubbing. + 2 pedal & radial pulses b/l.   NEUROLOGIC: Cranial nerves II through XII are intact. No focal Motor or sensory deficits appreciated b/l. Globally weak.  PSYCHIATRIC: The patient is alert and oriented x 3.  SKIN: No obvious rash, lesion, or ulcer.   LABORATORY PANEL:   CBC Recent Labs  Lab 10/19/18 1832  WBC 5.1  HGB 9.0*  HCT 26.5*  PLT 252   ------------------------------------------------------------------------------------------------------------------  Chemistries  Recent Labs  Lab 10/19/18 1832  NA 138  K 2.1*  CL 108  CO2 18*  GLUCOSE 101*  BUN 9  CREATININE UNABLE TO REPORT DUE TO ICTERUS MJU  CALCIUM 8.7*  MG 2.0  AST 158*  ALT 55*  ALKPHOS 192*  BILITOT 37.8*   ------------------------------------------------------------------------------------------------------------------  Cardiac Enzymes No results for input(s): TROPONINI in the last 168 hours. ------------------------------------------------------------------------------------------------------------------  RADIOLOGY:  No results found.   IMPRESSION AND PLAN:   53 year old female  with past medical history of alcohol abuse, PTSD, anxiety, bipolar disorder-recently diagnosed alcoholic liver cirrhosis who presents to the hospital due to rectal bleeding and suspected GI bleed and also noted to be hypokalemic.  1.  GI bleed-patient presents to the hospital with some rectal bleeding.  Patient was recently hospitalized at [facility] and underwent an upper GI endoscopy and colonoscopy.  Patient's colonoscopy showed some hemorrhoids and upper GI endoscopy showed some gastritis/portal gastropathy but no esophageal varices. - Hemoglobin currently stable, will follow serial hemoglobins.  Placed on IV Protonix, will get gastroenterology consult.  Placed on clear liquid diet.  2.  Hypokalemia- will replace the potassium orally and intravenously. -Repeat level in the morning, check magnesium level.  3.  Hypotension-secondary to underlying chronic liver disease combined with the GI bleed. -Patient is currently being transfused blood and will give the patient some IV fluids.  Follow hemodynamics.  Patient is clinically asymptomatic.  4.  Alcoholic liver cirrhosis- patient is severely jaundiced her total bilirubin is over 30. - Patient's prognosis is quite poor.  We will get palliative care consult discuss goals of care. - No evidence of hepatic encephalopathy.  Continue lactulose. - Await further gastroenterology input.  5.  Depression-continue trazodone.  All the records are reviewed and case discussed with ED provider. Management plans discussed with  the patient, family and they are in agreement.  CODE STATUS: DNR  TOTAL TIME TAKING CARE OF THIS PATIENT: 40 minutes.    Houston SirenVivek J Jalie Eiland M.D on 10/19/2018 at 10:21 PM  Between 7am to 6pm - Pager - [phone]  After 6pm go to [email or web address] - password [email or web address] [email or web address]  [facility]  Office  [phone]  CC: Primary care physician; [facility]

## 2018-10-19 NOTE — ED Provider Notes (Signed)
[facility] Emergency Department Provider Note    First MD Initiated Contact with Patient 10/19/18 1823     (approximate)  I have reviewed the triage vital signs and the nursing notes.   HISTORY  Chief Complaint Rectal Bleeding and Jaundice    HPI Shelley [name] is a 53 y.o. female below listed past medical history with recent mission to tertiary care facility for liver failure with alcohol abuse as well as suspected Tylenol ingestion presents the ER for bright red blood per rectum and generalized weakness that started this morning.  Patient somewhat poor historian.  States that she was admitted to [facility] and then sent over to [facility] though not able to see any of these records care everywhere.  Patient is profoundly jaundiced.  Denies any pain.  No reported fevers.    Past Medical History:  Diagnosis Date  . Anxiety   . Bipolar 1 disorder (HCC)   . Chronic kidney disease   . Hypertension   . PTSD (post-traumatic stress disorder)    Family History  Problem Relation Age of Onset  . Cancer Mother   . Alcohol abuse Mother   . Heart attack Father   . Cancer Father   . Depression Sister   . Hypertension Brother   . Diabetes Brother   . Drug abuse Sister   . Alcohol abuse Sister   . Obesity Sister   . Anxiety disorder Sister   . Depression Sister   . Hypertension Brother   . Depression Brother   . Alcohol abuse Brother    Past Surgical History:  Procedure Laterality Date  . BLADDER SURGERY    . none     Patient Active Problem List   Diagnosis Date Noted  . GI bleed 10/19/2018  . Bipolar disorder (HCC) 07/11/2015  . PTSD (post-traumatic stress disorder) 07/11/2015  . Generalized anxiety disorder 07/11/2015  . Dyspnea 05/09/2014  . Obesity 05/09/2014      Prior to Admission medications   Medication Sig Start Date End Date Taking? Authorizing Provider  lactulose (CHRONULAC) 10 GM/15ML solution TAKE 30 ML BY MOUTH TWICE  DAILY 10/06/18  Yes [provider]  Thiamine HCl (VITAMIN B1) 100 MG TABS Take 1 tablet by mouth daily. 10/07/18  Yes [provider]  traZODone (DESYREL) 100 MG tablet Take 1 tablet (100 mg total) by mouth at bedtime. 04/25/18  Yes Brandy HaleFaheem, Uzma, MD    Allergies Patient has no known allergies.    Social History Social History   Tobacco Use  . Smoking status: Never Smoker  . Smokeless tobacco: Never Used  Substance Use Topics  . Alcohol use: No    Alcohol/week: 0.0 standard drinks    Frequency: Never  . Drug use: No    Review of Systems Patient denies headaches, rhinorrhea, blurry vision, numbness, shortness of breath, chest pain, edema, cough, abdominal pain, nausea, vomiting, diarrhea, dysuria, fevers, rashes or hallucinations unless otherwise stated above in HPI. ____________________________________________   PHYSICAL EXAM:  VITAL SIGNS: Vitals:   10/19/18 2245 10/19/18 2300  BP:  (!) 92/57  Pulse: (!) 103 98  Resp: 20 16  Temp:    SpO2: 100% 100%    Constitutional: Alert and oriented. jaundiced Eyes: scleral icterus Head: Atraumatic. Nose: No congestion/rhinnorhea. Mouth/Throat: Mucous membranes are moist.   Neck: No stridor. Painless ROM.  Cardiovascular: Normal rate, regular rhythm. Grossly normal heart sounds.  Good peripheral circulation. Respiratory: Normal respiratory effort.  No retractions. Lungs CTAB. Gastrointestinal:  Soft and nontender. + distended with + fluid wave. No abdominal bruits. No CVA tenderness. Genitourinary: hemorrhoids without evidence of active bleeding Musculoskeletal: No lower extremity tenderness nor edema.  No joint effusions. Neurologic:  Normal speech and language. No gross focal neurologic deficits are appreciated. No facial droop Skin:  Skin is warm, dry and intact. No rash noted. Psychiatric: Mood and affect are normal. Speech and behavior are normal.  ____________________________________________   LABS  (all labs ordered are listed, but only abnormal results are displayed)  Results for orders placed or performed during the hospital encounter of 10/19/18 (from the past 24 hour(s))  Sample to Blood Bank     Status: None   Collection Time: 10/19/18  6:11 PM  Result Value Ref Range   Blood Bank Specimen SAMPLE AVAILABLE FOR TESTING    Sample Expiration      10/22/2018,2359 Performed at [facility], [address] [place], [place] [address]   Type and screen [place]     Status: None (Preliminary result)   Collection Time: 10/19/18  6:11 PM  Result Value Ref Range   ABO/RH(D) A NEG    Antibody Screen NEG    Sample Expiration 10/22/2018,2359    Unit Number [id number]    Blood Component Type RED CELLS,LR    Unit division 00    Status of Unit ISSUED    Transfusion Status OK TO TRANSFUSE    Crossmatch Result      Compatible Performed at [facility], [address] [place],  [address]    Unit Number [id number]    Blood Component Type RED CELLS,LR    Unit division 00    Status of Unit ALLOCATED    Transfusion Status OK TO TRANSFUSE    Crossmatch Result Compatible    Unit Number [id number]    Blood Component Type RED CELLS,LR    Unit division 00    Status of Unit ALLOCATED    Transfusion Status OK TO TRANSFUSE    Crossmatch Result Compatible    Unit Number [id number]    Blood Component Type RED CELLS,LR    Unit division 00    Status of Unit ALLOCATED    Transfusion Status OK TO TRANSFUSE    Crossmatch Result Compatible   Protime-INR     Status: Abnormal   Collection Time: 10/19/18  6:32 PM  Result Value Ref Range   Prothrombin Time 17.5 (H) 11.4 - 15.2 seconds   INR 1.5 (H) 0.8 - 1.2  CBC with Differential/Platelet     Status: Abnormal   Collection Time: 10/19/18  6:32 PM  Result Value Ref Range   WBC 5.1 4.0 - 10.5 K/uL   RBC 2.67 (L) 3.87 - 5.11 MIL/uL   Hemoglobin 9.0 (L) 12.0 - 15.0 g/dL   HCT 26.5 (L)  36.0 - 46.0 %   MCV 99.3 80.0 - 100.0 fL   MCH 33.7 26.0 - 34.0 pg   MCHC 34.0 30.0 - 36.0 g/dL   RDW 20.6 (H) 11.5 - 15.5 %   Platelets 252 150 - 400 K/uL   nRBC 0.0 0.0 - 0.2 %   Neutrophils Relative % 65 %   Neutro Abs 3.4 1.7 - 7.7 K/uL   Lymphocytes Relative 12 %   Lymphs Abs 0.6 (L) 0.7 - 4.0 K/uL   Monocytes Relative 20 %   Monocytes Absolute 1.0 0.1 - 1.0 K/uL   Eosinophils Relative 2 %   Eosinophils Absolute 0.1 0.0 - 0.5 K/uL  Basophils Relative 0 %   Basophils Absolute 0.0 0.0 - 0.1 K/uL   Immature Granulocytes 1 %   Abs Immature Granulocytes 0.03 0.00 - 0.07 K/uL  Comprehensive metabolic panel     Status: Abnormal   Collection Time: 10/19/18  6:32 PM  Result Value Ref Range   Sodium 138 135 - 145 mmol/L   Potassium 2.1 (LL) 3.5 - 5.1 mmol/L   Chloride 108 98 - 111 mmol/L   CO2 18 (L) 22 - 32 mmol/L   Glucose, Bld 101 (H) 70 - 99 mg/dL   BUN 9 6 - 20 mg/dL   Creatinine, Ser UNABLE TO REPORT DUE TO ICTERUS MJU 0.44 - 1.00 mg/dL   Calcium 8.7 (L) 8.9 - 10.3 mg/dL   Total Protein 5.8 (L) 6.5 - 8.1 g/dL   Albumin 2.5 (L) 3.5 - 5.0 g/dL   AST 161158 (H) 15 - 41 U/L   ALT 55 (H) 0 - 44 U/L   Alkaline Phosphatase 192 (H) 38 - 126 U/L   Total Bilirubin 37.8 (HH) 0.3 - 1.2 mg/dL   GFR calc non Af Amer NOT CALCULATED >60 mL/min   GFR calc Af Amer NOT CALCULATED >60 mL/min   Anion gap 12 5 - 15  Magnesium     Status: None   Collection Time: 10/19/18  6:32 PM  Result Value Ref Range   Magnesium 2.0 1.7 - 2.4 mg/dL  SARS Coronavirus 2 (CEPHEID- Performed in [facility] hospital lab), Hosp Order     Status: None   Collection Time: 10/19/18  6:39 PM   Specimen: Nasopharyngeal Swab  Result Value Ref Range   SARS Coronavirus 2 NEGATIVE NEGATIVE  Prepare RBC     Status: None   Collection Time: 10/19/18  8:25 PM  Result Value Ref Range   Order Confirmation      ORDER PROCESSED BY BLOOD BANK Performed at [facility], [address] [place], [place] [id number]     ____________________________________________  EKG My review and personal interpretation at Time: 22:00   Indication: hypotension  Rate: 98  Rhythm: sinus Axis: right Other: prolonged qt ____________________________________________  RADIOLOGY  I personally reviewed all radiographic images ordered to evaluate for the above acute complaints and reviewed radiology reports and findings.  These findings were personally discussed with the patient.  Please see medical record for radiology report.  ____________________________________________   PROCEDURES  Procedure(s) performed:  .Critical Care Performed by: Willy Eddyobinson, Tyjai Charbonnet, MD Authorized by: Willy Eddyobinson, Graycee Greeson, MD   Critical care provider statement:    Critical care time (minutes):  40   Critical care time was exclusive of:  Separately billable procedures and treating other patients   Critical care was necessary to treat or prevent imminent or life-threatening deterioration of the following conditions:  Hepatic failure   Critical care was time spent personally by me on the following activities:  Development of treatment plan with patient or surrogate, discussions with consultants, evaluation of patient's response to treatment, examination of patient, obtaining history from patient or surrogate, ordering and performing treatments and interventions, ordering and review of laboratory studies, ordering and review of radiographic studies, pulse oximetry, re-evaluation of patient's condition and review of old charts      Critical Care performed: yes ____________________________________________   INITIAL IMPRESSION / ASSESSMENT AND PLAN / ED COURSE  Pertinent labs & imaging results that were available during my care of the patient were reviewed by me and considered in my medical decision making (see chart for details).  DDX: ugib, lgib, hemorrhjoid, varicese, liver failure, hepatiorenal, sbp  Shelley [name] is a 53 y.o. who presents  to the ED with symptoms as described above.  Patient very ill-appearing jaundiced hypotensive with bright red blood per rectum.  Does have evidence of hemorrhoids.  Will start transfusion.  Will check blood work for blood differential.  Clinical Course as of Oct 19 2323  Wed Oct 19, 2018  2111 Patient with significant jaundice.  Will consult [facility].  Patient require admission likely transfer.   [PR]  2216 Discussed with Dr. Clelia CroftShaw of hepatology at [facility] is familiar with patient.  She had similar presentation to their hospital.  Had extensive work-up including endoscopy and colonoscopy.  Endoscopy showed no evidence of varices only mild area of gastric antrum erythema without active bleeding.  Normal duodenum.  Colonoscopy was normal with exception of hemorrhoids that were nonbleeding at that time.  They did a biopsy of the liver which showed evidence of alcoholic hepatitis.  Patient with long history of psychiatric illness and alcohol dependence and reportedly continues to drink.  We discussed the laboratory abnormalities patient will require admission but does not require transfer.  I discussed case with Dr. Norma Fredricksonoledo of GI who is aware of patient.  Remains hemodynamically stable currently receiving transfusion.  INR is actually improved as compared to previous and platelets are normal.  Patient will be admitted for further medical management.   [PR]    Clinical Course User Index [PR] Willy Eddyobinson, Maurice Fotheringham, MD    The patient was evaluated in Emergency Department today for the symptoms described in the history of present illness. He/she was evaluated in the context of the global COVID-19 pandemic, which necessitated consideration that the patient might be at risk for infection with the SARS-CoV-2 virus that causes COVID-19. Institutional protocols and algorithms that pertain to the evaluation of patients at risk for COVID-19 are in a state of rapid change based on information released by regulatory bodies including  the CDC and federal and state organizations. These policies and algorithms were followed during the patient's care in the ED.  As part of my medical decision making, I reviewed the following data within the electronic MEDICAL RECORD NUMBER Nursing notes reviewed and incorporated, Labs reviewed, notes from prior ED visits and [place] Controlled Substance Database   ____________________________________________   FINAL CLINICAL IMPRESSION(S) / ED DIAGNOSES  Final diagnoses:  Alcoholic cirrhosis, unspecified whether ascites present (HCC)  Bright red blood per rectum  Hypokalemia      NEW MEDICATIONS STARTED DURING THIS VISIT:  New Prescriptions   No medications on file     Note:  This document was prepared using Dragon voice recognition software and may include unintentional dictation errors.    Willy Eddyobinson, Joahan Swatzell, MD 10/19/18 2325

## 2018-10-19 NOTE — ED Notes (Signed)
Dr. Quentin Cornwall notified of bp 81/49 after receiving blood for 15 mins, ok to monitor

## 2018-10-19 NOTE — ED Notes (Signed)
Pt used bed pan with a small amount of bloody diarrhea

## 2018-10-19 NOTE — ED Triage Notes (Addendum)
Pt presenst to ED via POV with c/o rectal bleeding at this time. Pt's fiance states was recently D/C from [facility]. Pt presents to ED with c/o rectal pain and bleeding, pt is noted with severe jaundice and yellow sclera. Pt states recently at [facility] she had be given 7 units of blood.

## 2018-10-19 NOTE — ED Notes (Signed)
ED TO INPATIENT HANDOFF REPORT  ED Nurse Name and Phone #: [phone]   S Name/Age/Gender Shelley NimsMargaret G [name] 53 y.o. female Room/Bed: ED24A/ED24A  Code Status   Code Status: Not on file  Home/SNF/Other Home Patient oriented to: self, place, time and situation Is this baseline? Yes   Triage Complete: Triage complete  Chief Complaint rectal bleeding  Triage Note Pt presenst to ED via POV with c/o rectal bleeding at this time. Pt's fiance states was [facility]. Pt presents to ED with c/o rectal pain and bleeding, pt is noted with severe jaundice and yellow sclera. [facility] she had be given 7 units of blood.    Allergies No Known Allergies  Level of Care/Admitting Diagnosis ED Disposition    ED Disposition Condition Comment   Admit  [facility]: [facility] [[id number]]  Level of Care: Med-Surg [16]  Covid Evaluation: Confirmed COVID Negative  Diagnosis: GI bleed [[id number]][[id number]]  Admitting Physician: Houston SirenSAINANI, VIVEK J [[id number]]  Attending Physician: Houston SirenSAINANI, VIVEK J [[id number]]  Estimated length of stay: past midnight tomorrow  Certification:: I certify this patient will need inpatient services for at least 2 midnights  PT Class (Do Not Modify): Inpatient [101]  PT Acc Code (Do Not Modify): Private [1]       B Medical/Surgery History Past Medical History:  Diagnosis Date  . Anxiety   . Bipolar 1 disorder (HCC)   . Chronic kidney disease   . Hypertension   . PTSD (post-traumatic stress disorder)    Past Surgical History:  Procedure Laterality Date  . BLADDER SURGERY    . none       A IV Location/Drains/Wounds Patient Lines/Drains/Airways Status   Active Line/Drains/Airways    Name:   Placement date:   Placement time:   Site:   Days:   Peripheral IV 09/22/18 Right Antecubital   09/22/18    0613    Antecubital   27   Peripheral IV 09/22/18 Left Wrist   09/22/18    0614    Wrist   27   Peripheral IV 10/19/18 Right Antecubital    10/19/18    1815    Antecubital   less than 1   Peripheral IV 10/19/18 Left Arm   10/19/18    2201    Arm   less than 1          Intake/Output Last 24 hours No intake or output data in the 24 hours ending 10/19/18 2245  Labs/Imaging Results for orders placed or performed during the hospital encounter of 10/19/18 (from the past 48 hour(s))  Sample to Blood Bank     Status: None   Collection Time: 10/19/18  6:11 PM  Result Value Ref Range   Blood Bank Specimen SAMPLE AVAILABLE FOR TESTING    Sample Expiration      10/22/2018,2359 Performed at [facility], [address] [place], [place] [address]   Type and screen [facility]     Status: None (Preliminary result)   Collection Time: 10/19/18  6:11 PM  Result Value Ref Range   ABO/RH(D) A NEG    Antibody Screen NEG    Sample Expiration 10/22/2018,2359    Unit Number [id number]    Blood Component Type RED CELLS,LR    Unit division 00    Status of Unit ISSUED    Transfusion Status OK TO TRANSFUSE    Crossmatch Result      Compatible Performed at [facility], [address] [place], [place] [address]    Unit Number [id number]    Blood Component Type RED CELLS,LR    Unit division 00    Status of Unit ALLOCATED    Transfusion Status OK TO TRANSFUSE    Crossmatch Result Compatible    Unit Number [id number]    Blood Component Type RED CELLS,LR    Unit division 00    Status of Unit ALLOCATED    Transfusion Status OK TO TRANSFUSE    Crossmatch Result Compatible    Unit Number [id number]    Blood Component Type RED CELLS,LR    Unit division 00    Status of Unit ALLOCATED    Transfusion Status OK TO TRANSFUSE    Crossmatch Result Compatible   Protime-INR     Status: Abnormal   Collection Time: 10/19/18  6:32 PM  Result Value Ref Range   Prothrombin Time 17.5 (H) 11.4 - 15.2 seconds   INR 1.5 (H) 0.8 - 1.2    Comment: (NOTE) INR goal varies based on device and disease  states. Performed at [facility], [address] [place], [place] [address]   CBC with Differential/Platelet     Status: Abnormal   Collection Time: 10/19/18  6:32 PM  Result Value Ref Range   WBC 5.1 4.0 - 10.5 K/uL   RBC 2.67 (L) 3.87 - 5.11 MIL/uL   Hemoglobin 9.0 (L) 12.0 - 15.0 g/dL   HCT 10.226.5 (L) 72.536.0 - 36.646.0 %   MCV 99.3 80.0 - 100.0 fL   MCH 33.7 26.0 - 34.0 pg   MCHC 34.0 30.0 - 36.0 g/dL   RDW 44.020.6 (H) 34.711.5 - 42.515.5 %   Platelets 252 150 - 400 K/uL   nRBC 0.0 0.0 - 0.2 %   Neutrophils Relative % 65 %   Neutro Abs 3.4 1.7 - 7.7 K/uL   Lymphocytes Relative 12 %   Lymphs Abs 0.6 (L) 0.7 - 4.0 K/uL   Monocytes Relative 20 %   Monocytes Absolute 1.0 0.1 - 1.0 K/uL   Eosinophils Relative 2 %   Eosinophils Absolute 0.1 0.0 - 0.5 K/uL   Basophils Relative 0 %   Basophils Absolute 0.0 0.0 - 0.1 K/uL   Immature Granulocytes 1 %   Abs Immature Granulocytes 0.03 0.00 - 0.07 K/uL    Comment: Performed at [facility], [address] [place], [place] [address]  Comprehensive metabolic panel     Status: Abnormal   Collection Time: 10/19/18  6:32 PM  Result Value Ref Range   Sodium 138 135 - 145 mmol/L   Potassium 2.1 (LL) 3.5 - 5.1 mmol/L    Comment: CRITICAL RESULT CALLED TO, READ BACK BY AND VERIFIED WITH RAQUEL DAVID @2032  10/19/18 MJU    Chloride 108 98 - 111 mmol/L   CO2 18 (L) 22 - 32 mmol/L   Glucose, Bld 101 (H) 70 - 99 mg/dL   BUN 9 6 - 20 mg/dL   Creatinine, Ser UNABLE TO REPORT DUE TO ICTERUS MJU 0.44 - 1.00 mg/dL   Calcium 8.7 (L) 8.9 - 10.3 mg/dL   Total Protein 5.8 (L) 6.5 - 8.1 g/dL   Albumin 2.5 (L) 3.5 - 5.0 g/dL   AST 756158 (H) 15 - 41 U/L   ALT 55 (H) 0 - 44 U/L    Comment: ICTERUS AT THIS LEVEL MAY AFFECT RESULT   Alkaline Phosphatase 192 (H) 38 - 126 U/L   Total Bilirubin 37.8 (HH) 0.3 -  1.2 mg/dL    Comment: RESULT CONFIRMED BY MANUAL DILUTION MJU   GFR calc non Af Amer NOT CALCULATED >60 mL/min   GFR calc Af Amer NOT CALCULATED  >60 mL/min   Anion gap 12 5 - 15    Comment: Performed at [facility], [address] [place], [place] [address]  Magnesium     Status: None   Collection Time: 10/19/18  6:32 PM  Result Value Ref Range   Magnesium 2.0 1.7 - 2.4 mg/dL    Comment: Performed at [facility], [address] [place], [place] [address]  SARS Coronavirus 2 (CEPHEID- Performed in [facility]), Hosp Order     Status: None   Collection Time: 10/19/18  6:39 PM   Specimen: Nasopharyngeal Swab  Result Value Ref Range   SARS Coronavirus 2 NEGATIVE NEGATIVE    Comment: (NOTE) If result is NEGATIVE SARS-CoV-2 target nucleic acids are NOT DETECTED. The SARS-CoV-2 RNA is generally detectable in upper and lower  respiratory specimens during the acute phase of infection. The lowest  concentration of SARS-CoV-2 viral copies this assay can detect is 250  copies / mL. A negative result does not preclude SARS-CoV-2 infection  and should not be used as the sole basis for treatment or other  patient management decisions.  A negative result may occur with  improper specimen collection / handling, submission of specimen other  than nasopharyngeal swab, presence of viral mutation(s) within the  areas targeted by this assay, and inadequate number of viral copies  (<250 copies / mL). A negative result must be combined with clinical  observations, patient history, and epidemiological information. If result is POSITIVE SARS-CoV-2 target nucleic acids are DETECTED. The SARS-CoV-2 RNA is generally detectable in upper and lower  respiratory specimens dur ing the acute phase of infection.  Positive  results are indicative of active infection with SARS-CoV-2.  Clinical  correlation with patient history and other diagnostic information is  necessary to determine patient infection status.  Positive results do  not rule out bacterial infection or co-infection with other viruses. If result is  PRESUMPTIVE POSTIVE SARS-CoV-2 nucleic acids MAY BE PRESENT.   A presumptive positive result was obtained on the submitted specimen  and confirmed on repeat testing.  While 2019 novel coronavirus  (SARS-CoV-2) nucleic acids may be present in the submitted sample  additional confirmatory testing may be necessary for epidemiological  and / or clinical management purposes  to differentiate between  SARS-CoV-2 and other Sarbecovirus currently known to infect humans.  If clinically indicated additional testing with an alternate test  methodology [id number]) is advised. The SARS-CoV-2 RNA is generally  detectable in upper and lower respiratory sp ecimens during the acute  phase of infection. The expected result is Negative. Fact Sheet for Patients:  BoilerBrush.com.cyhttps://www.fda.gov/media/136312/download Fact Sheet for Healthcare Providers: https://pope.com/https://www.fda.gov/media/136313/download This test is not yet approved or cleared by the [place] FDA and has been authorized for detection and/or diagnosis of SARS-CoV-2 by FDA under an Emergency Use Authorization (EUA).  This EUA will remain in effect (meaning this test can be used) for the duration of the COVID-19 declaration under Section 564(b)(1) of the Act, 21 U.S.C. section 360bbb-3(b)(1), unless the authorization is terminated or revoked sooner. Performed at [facility], [address] [place], [place] [address]   Prepare RBC     Status: None   Collection Time: 10/19/18  8:25 PM  Result Value Ref Range   Order Confirmation  ORDER PROCESSED BY BLOOD BANK Performed at [facility], [address] [place], [place] [address]    No results found.  Pending Labs Unresulted Labs (From admission, onward)    Start     Ordered   10/19/18 2051  Ammonia  Add-on,   AD     10/19/18 2050   Signed and Held  HIV antibody (Routine Testing)  Once,   R     Signed and Held   Signed and Held  CBC  Tomorrow morning,   R     Signed and Held    Signed and Held  Comprehensive metabolic panel  Tomorrow morning,   R     Signed and Held   Signed and Held  Magnesium  Add-on,   STAT     Signed and Held          Vitals/Pain Today's Vitals   10/19/18 2130 10/19/18 2139 10/19/18 2145 10/19/18 2200  BP: 97/60 94/61  (!) 81/42  Pulse: 96  (!) 105 99  Resp: (!) 22  (!) 29   Temp:  98.5 F (36.9 C)  98.9 F (37.2 C)  TempSrc:  Oral    SpO2: 99%  100% 100%  Weight:      Height:      PainSc:        Isolation Precautions Airborne and Contact precautions  Medications Medications  0.9 %  sodium chloride infusion (has no administration in time range)  pantoprazole (PROTONIX) 80 mg in sodium chloride 0.9 % 100 mL IVPB (has no administration in time range)  octreotide (SANDOSTATIN) 500 mcg in sodium chloride 0.9 % 250 mL (2 mcg/mL) infusion (has no administration in time range)  potassium chloride 10 mEq in 100 mL IVPB ( Intravenous Restarted 10/19/18 2241)    Mobility walks with person assist High fall risk   Focused Assessments 1   R Recommendations: See Admitting Provider Note  Report given to:   Additional Notes:

## 2018-10-19 NOTE — ED Notes (Signed)
Pt brought to rm 24 from triage by wheelchair. Pt in NAD at this time, no c/o pain. A&Ox4. Pt's skin and eyes are yellow.

## 2018-10-19 NOTE — Progress Notes (Signed)
   [place] [facility] Day: 0 days Shelley [name] is a 53 y.o. female with past medical history of alcohol abuse, PTSD, bipolar disorder, recently diagnosed alcoholic liver cirrhosis presenting with Rectal Bleeding and Jaundice .   Discussed CODE STATUS with patient at bedside.  Given patient's advanced liver cirrhosis and severe jaundice her prognosis is quite poor.  Explained to her the difference between full code and DNR.  Patient does not want aggressive cardiopulmonary resuscitation or heroic measures to save her life.  She prefers to be a DNR presently.  Advance care planning discussed with patient  without additional Family at bedside. All questions in regards to overall condition and expected prognosis answered. The decision was made to continue current code status  CODE STATUS: dnr Time spent: 16 minutes

## 2018-10-20 ENCOUNTER — Other Ambulatory Visit: Payer: Self-pay

## 2018-10-20 ENCOUNTER — Encounter: Payer: Self-pay | Admitting: Radiology

## 2018-10-20 DIAGNOSIS — K7030 Alcoholic cirrhosis of liver without ascites: Secondary | ICD-10-CM

## 2018-10-20 DIAGNOSIS — K7010 Alcoholic hepatitis without ascites: Secondary | ICD-10-CM

## 2018-10-20 DIAGNOSIS — Z515 Encounter for palliative care: Secondary | ICD-10-CM

## 2018-10-20 DIAGNOSIS — Z7189 Other specified counseling: Secondary | ICD-10-CM

## 2018-10-20 LAB — COMPREHENSIVE METABOLIC PANEL
ALT: 55 U/L — ABNORMAL HIGH (ref 0–44)
ALT: 46 U/L — ABNORMAL HIGH (ref 0–44)
AST: 158 U/L — ABNORMAL HIGH (ref 15–41)
AST: 145 U/L — ABNORMAL HIGH (ref 15–41)
Albumin: 2.5 g/dL — ABNORMAL LOW (ref 3.5–5.0)
Albumin: 2.2 g/dL — ABNORMAL LOW (ref 3.5–5.0)
Alkaline Phosphatase: 192 U/L — ABNORMAL HIGH (ref 38–126)
Alkaline Phosphatase: 165 U/L — ABNORMAL HIGH (ref 38–126)
Anion gap: 12 (ref 5–15)
Anion gap: 11 (ref 5–15)
BUN: 9 mg/dL (ref 6–20)
BUN: 10 mg/dL (ref 6–20)
CO2: 18 mmol/L — ABNORMAL LOW (ref 22–32)
CO2: 17 mmol/L — ABNORMAL LOW (ref 22–32)
Calcium: 8.7 mg/dL — ABNORMAL LOW (ref 8.9–10.3)
Calcium: 8.2 mg/dL — ABNORMAL LOW (ref 8.9–10.3)
Chloride: 108 mmol/L (ref 98–111)
Chloride: 110 mmol/L (ref 98–111)
Creatinine, Ser: UNDETERMINED mg/dL (ref 0.44–1.00)
Creatinine, Ser: UNDETERMINED mg/dL (ref 0.44–1.00)
Glucose, Bld: 101 mg/dL — ABNORMAL HIGH (ref 70–99)
Glucose, Bld: 61 mg/dL — ABNORMAL LOW (ref 70–99)
Potassium: 2.1 mmol/L — CL (ref 3.5–5.1)
Potassium: <2 mmol/L — CL (ref 3.5–5.1)
Sodium: 138 mmol/L (ref 135–145)
Sodium: 138 mmol/L (ref 135–145)
Total Bilirubin: 37.8 mg/dL (ref 0.3–1.2)
Total Bilirubin: 33.4 mg/dL (ref 0.3–1.2)
Total Protein: 5.8 g/dL — ABNORMAL LOW (ref 6.5–8.1)
Total Protein: 5.2 g/dL — ABNORMAL LOW (ref 6.5–8.1)

## 2018-10-20 LAB — MAGNESIUM
Magnesium: 2 mg/dL (ref 1.7–2.4)
Magnesium: 2 mg/dL (ref 1.7–2.4)

## 2018-10-20 LAB — POTASSIUM: Potassium: 2.6 mmol/L — CL (ref 3.5–5.1)

## 2018-10-20 LAB — HEMOGLOBIN AND HEMATOCRIT, BLOOD
HCT: 28.6 % — ABNORMAL LOW (ref 36.0–46.0)
Hemoglobin: 9.8 g/dL — ABNORMAL LOW (ref 12.0–15.0)

## 2018-10-20 LAB — CBC
HCT: 26.8 % — ABNORMAL LOW (ref 36.0–46.0)
Hemoglobin: 9.3 g/dL — ABNORMAL LOW (ref 12.0–15.0)
MCH: 33 pg (ref 26.0–34.0)
MCHC: 34.7 g/dL (ref 30.0–36.0)
MCV: 95 fL (ref 80.0–100.0)
Platelets: 211 10*3/uL (ref 150–400)
RBC: 2.82 MIL/uL — ABNORMAL LOW (ref 3.87–5.11)
RDW: 20.4 % — ABNORMAL HIGH (ref 11.5–15.5)
WBC: 5 10*3/uL (ref 4.0–10.5)
nRBC: 0 % (ref 0.0–0.2)

## 2018-10-20 LAB — AMMONIA: Ammonia: 95 umol/L — ABNORMAL HIGH (ref 9–35)

## 2018-10-20 LAB — PREPARE RBC (CROSSMATCH)

## 2018-10-20 MED ORDER — POTASSIUM CHLORIDE CRYS ER 20 MEQ PO TBCR
40.0000 meq | EXTENDED_RELEASE_TABLET | Freq: Once | ORAL | Status: AC
  Administered 2018-10-20: 17:00:00 40 meq via ORAL
  Filled 2018-10-20: qty 4

## 2018-10-20 MED ORDER — ACETAMINOPHEN 650 MG RE SUPP: 650.0000 mg | Freq: Four times a day (QID) | RECTAL | Status: DC | PRN

## 2018-10-20 MED ORDER — ONDANSETRON HCL 4 MG PO TABS: 4.0000 mg | ORAL_TABLET | Freq: Four times a day (QID) | ORAL | Status: DC | PRN

## 2018-10-20 MED ORDER — POTASSIUM CHLORIDE CRYS ER 20 MEQ PO TBCR: 60.0000 meq | EXTENDED_RELEASE_TABLET | Freq: Once | ORAL | Status: DC

## 2018-10-20 MED ORDER — VITAMIN B-1 100 MG PO TABS
100.0000 mg | ORAL_TABLET | Freq: Every day | ORAL | Status: DC
  Administered 2018-10-20 – 2018-10-24 (×5): 100 mg via ORAL
  Filled 2018-10-20 (×5): qty 1

## 2018-10-20 MED ORDER — LACTULOSE 10 GM/15ML PO SOLN
30.0000 g | Freq: Two times a day (BID) | ORAL | Status: DC
  Administered 2018-10-20 – 2018-10-24 (×9): 30 g via ORAL
  Filled 2018-10-20 (×9): qty 60

## 2018-10-20 MED ORDER — ONDANSETRON HCL 4 MG/2ML IJ SOLN: 4.0000 mg | Freq: Four times a day (QID) | INTRAMUSCULAR | Status: DC | PRN

## 2018-10-20 MED ORDER — POTASSIUM CHLORIDE CRYS ER 20 MEQ PO TBCR
40.0000 meq | EXTENDED_RELEASE_TABLET | Freq: Two times a day (BID) | ORAL | Status: AC
  Administered 2018-10-20 – 2018-10-21 (×4): 40 meq via ORAL
  Filled 2018-10-20 (×4): qty 2

## 2018-10-20 MED ORDER — ACETAMINOPHEN 325 MG PO TABS: 650.0000 mg | ORAL_TABLET | Freq: Four times a day (QID) | ORAL | Status: DC | PRN

## 2018-10-20 MED ORDER — SODIUM CHLORIDE 0.9 % IV SOLN
Freq: Once | INTRAVENOUS | Status: AC
  Administered 2018-10-20: 04:00:00 via INTRAVENOUS

## 2018-10-20 MED ORDER — SODIUM CHLORIDE 0.9 % IV BOLUS
500.0000 mL | Freq: Once | INTRAVENOUS | Status: AC
  Administered 2018-10-20: 20:00:00 500 mL via INTRAVENOUS

## 2018-10-20 MED ORDER — POTASSIUM CHLORIDE CRYS ER 20 MEQ PO TBCR
EXTENDED_RELEASE_TABLET | ORAL | Status: AC
  Administered 2018-10-20: 03:00:00 60 meq
  Filled 2018-10-20: qty 3

## 2018-10-20 MED ORDER — PANTOPRAZOLE SODIUM 40 MG IV SOLR: 40.0000 mg | Freq: Two times a day (BID) | INTRAVENOUS | Status: DC

## 2018-10-20 MED ORDER — SODIUM CHLORIDE 0.9 % IV BOLUS
250.0000 mL | Freq: Once | INTRAVENOUS | Status: AC
  Administered 2018-10-20: 250 mL via INTRAVENOUS

## 2018-10-20 MED ORDER — POTASSIUM CHLORIDE IN NACL 20-0.9 MEQ/L-% IV SOLN
INTRAVENOUS | Status: DC
  Administered 2018-10-20 – 2018-10-21 (×3): via INTRAVENOUS
  Filled 2018-10-20 (×5): qty 1000

## 2018-10-20 MED ORDER — PANTOPRAZOLE SODIUM 40 MG IV SOLR
40.0000 mg | Freq: Two times a day (BID) | INTRAVENOUS | Status: DC
  Administered 2018-10-20 – 2018-10-21 (×2): 40 mg via INTRAVENOUS
  Filled 2018-10-20 (×2): qty 40

## 2018-10-20 MED ORDER — TRAZODONE HCL 50 MG PO TABS
100.0000 mg | ORAL_TABLET | Freq: Every day | ORAL | Status: DC
  Administered 2018-10-20 – 2018-10-23 (×4): 100 mg via ORAL
  Filled 2018-10-20 (×5): qty 2

## 2018-10-20 NOTE — Progress Notes (Signed)
MD Mansy notified of BP 73/40 and Potassium >2 , Rapid called for BP 73/40. Pt A&Ox4 asymptomatic other wise. New orders given. Will continue to monitor. VS taken and continuous monitoring initiated.

## 2018-10-20 NOTE — Progress Notes (Signed)
PHARMACY CONSULT NOTE - FOLLOW UP  Pharmacy Consult for Electrolyte Monitoring and Replacement   Recent Labs: Potassium (mmol/L)  Date Value  10/20/2018 <2.0 (LL)   Magnesium (mg/dL)  Date Value  10/20/2018 2.0   Calcium (mg/dL)  Date Value  10/20/2018 8.2 (L)   Albumin (g/dL)  Date Value  10/20/2018 2.2 (L)   Sodium (mmol/L)  Date Value  10/20/2018 138  01/17/2014 142     Assessment: 53 year old female with h/o alcoholic hepatitis per recent hospitalization at [facility]. Patient presented with rectal bleeding. Potassium 2.1 on admission then < 2 with morning labs.  Goal of Therapy:  Electrolytes WNL  Plan:  7/1 K < 2. Patient has received a total of 70 mEq potassium and has NS w/ K @ 50 mL/hr. Potassium 40 mEq PO scheduled for 1000.  I have ordered potassium check for 1400.  Pharmacy will continue to follow and replace electrolytes as indicated.  Tawnya Crook ,PharmD Clinical Pharmacist 10/20/2018 8:39 AM

## 2018-10-20 NOTE — Consult Note (Signed)
Consultation Note Date: 10/20/2018   Patient Name: Shelley [name]  DOB: [birth year]  MRN: [id number]  Age / Sex: 53 y.o., female  [facility] Referring Physician: Shaune Pollackhen, Qing, MD  Reason for Consultation: Establishing goals of care  HPI/Patient Profile: 53 y.o. female  with past medical history of anxiety, bipolar 1 disorder, PTSD, chronic kidney disease, new diagnosis of alcoholic liver cirrhosis, hypertension, recent GI bleed with 2-1/2 to 3-week hospitalization, admitted on 10/19/2018 with rectal bleeding.   Clinical Assessment and Goals of Care: Shelley [name], Makyia, is resting quietly in bed.  She wakes easily making and mostly keeping eye contact.  She appears chronically ill, somewhat frail.  She is alert and oriented x3, able to make her needs known.  There is no family at bedside at this time.  Shelley [name] shares that her Ray ChurchSister [name] came down from [place] as soon as she heard of her illness.  She shares that Scarlette CalicoFrances told her she is "button yellow".  Shelley [name] shares details about her earlier illness hospitalization.  We talked about the treatment plan including ultrasound of abdomen, more labs in the a.m., discussions about what is next.  HC POA discussed, see below. PMT to follow-up, begin discussions related to hospice care.  HCPOA    OTHER -Shelley [name] names her significant other, Shelley [name] as her Social research officer, governmentsurrogate decision-maker. she tells me that she has 3 daughters all living in [place].  Shelley [name] shares that her daughters understand that Shelley [name] is her Social research officer, governmentsurrogate decision-maker.  Her siblings also live in [place].  Her parents are deceased.    SUMMARY OF RECOMMENDATIONS   24 to 48 hours for outcomes Continue to treat the treatable but no CPR, no intubation PMT will open the subject of hospice during next visit  Code Status/Advance Care Planning:  DNR  Symptom Management:    Per hospitalist, no additional needs at this time.  Palliative Prophylaxis:   Frequent Pain Assessment and Turn Reposition  Additional Recommendations (Limitations, Scope, Preferences):  Treat the treatable but no CPR, no intubation  Psycho-social/Spiritual:   Desire for further Chaplaincy support:no  Additional Recommendations: Caregiving  Support/Resources and Education on Hospice  Prognosis:   Unable to determine, 6 months or less would be expected based on decreasing functional status, severity of illness.  Discharge Planning: To be determined, anticipate home with home health versus home with hospice      Primary Diagnoses: Present on Admission: . GI bleed   I have reviewed the medical record, interviewed the patient and family, and examined the patient. The following aspects are pertinent.  Past Medical History:  Diagnosis Date  . Anxiety   . Bipolar 1 disorder (HCC)   . Chronic kidney disease   . Hypertension   . PTSD (post-traumatic stress disorder)    Social History   Socioeconomic History  . Marital status: Married    Spouse name: Not on file  . Number of children: 3  . Years of education: Not on file  . Highest education level: Not on file  Occupational History  . Not on file  Social Needs  . Financial resource strain: Patient refused  . Food insecurity    Worry: Patient refused    Inability: Patient refused  . Transportation needs    Medical: Patient refused    Non-medical: Patient refused  Tobacco Use  . Smoking status: Never Smoker  . Smokeless tobacco: Never Used  Substance and Sexual Activity  . Alcohol use: No    Alcohol/week: 0.0 standard drinks    Frequency: Never  . Drug use: No  . Sexual activity: Not Currently    Birth control/protection: None  Lifestyle  . Physical activity    Days per week: Patient refused    Minutes per session: Patient refused  . Stress: Patient refused  Relationships  . Social Herbalist  on phone: Patient refused    Gets together: Patient refused    Attends religious service: Patient refused    Active member of club or organization: Patient refused    Attends meetings of clubs or organizations: Patient refused    Relationship status: Patient refused  Other Topics Concern  . Not on file  Social History Narrative   Lives home with Boyfriend   Family History  Problem Relation Age of Onset  . Cancer Mother   . Alcohol abuse Mother   . Heart attack Father   . Cancer Father   . Depression Sister   . Hypertension Brother   . Diabetes Brother   . Drug abuse Sister   . Alcohol abuse Sister   . Obesity Sister   . Anxiety disorder Sister   . Depression Sister   . Hypertension Brother   . Depression Brother   . Alcohol abuse Brother    Scheduled Meds: . lactulose  30 g Oral BID  . pantoprazole (PROTONIX) IV  40 mg Intravenous Q12H  . potassium chloride  40 mEq Oral BID  . potassium chloride  40 mEq Oral Once  . thiamine  100 mg Oral Daily  . traZODone  100 mg Oral QHS   Continuous Infusions: . 0.9 % NaCl with KCl 20 mEq / L 50 mL/hr at 10/20/18 1407  . octreotide  (SANDOSTATIN)    IV infusion 50 mcg/hr (10/20/18 1407)   PRN Meds:.acetaminophen **OR** acetaminophen, ondansetron **OR** ondansetron (ZOFRAN) IV Medications Prior to Admission:  Prior to Admission medications   Medication Sig Start Date End Date Taking? Authorizing Provider  lactulose (CHRONULAC) 10 GM/15ML solution TAKE 30 ML BY MOUTH TWICE DAILY 10/06/18  Yes [provider]  Thiamine HCl (VITAMIN B1) 100 MG TABS Take 1 tablet by mouth daily. 10/07/18  Yes [provider]  traZODone (DESYREL) 100 MG tablet Take 1 tablet (100 mg total) by mouth at bedtime. 04/25/18  Yes Rainey Pines, MD   No Known Allergies Review of Systems  Unable to perform ROS: Mental status change    Physical Exam Vitals signs and nursing note reviewed.  Constitutional:      General: She is not in acute  distress.    Appearance: She is ill-appearing.  HENT:     Head: Atraumatic.  Cardiovascular:     Rate and Rhythm: Normal rate.  Pulmonary:     Effort: Pulmonary effort is normal. No respiratory distress.  Abdominal:     General: There is distension.     Palpations: Abdomen is soft.     Tenderness: There is no abdominal tenderness. There is no guarding.  Skin:    General: Skin  is warm and dry.     Coloration: Skin is jaundiced.  Neurological:     General: No focal deficit present.     Mental Status: She is alert and oriented to person, place, and time.  Psychiatric:     Comments: Calm and cooperative     Vital Signs: BP (!) 80/60   Pulse 83   Temp 98.6 F (37 C) (Oral)   Resp 20   Ht 5\' 3"  (1.6 m)   Wt 70.3 kg   SpO2 99%   BMI 27.46 kg/m  Pain Scale: 0-10   Pain Score: 0-No pain   SpO2: SpO2: 99 % O2 Device:SpO2: 99 % O2 Flow Rate: .   IO: Intake/output summary:   Intake/Output Summary (Last 24 hours) at 10/20/2018 1500 Last data filed at 10/20/2018 1407 Gross per 24 hour  Intake 2702.56 ml  Output -  Net 2702.56 ml    LBM: Last BM Date: 10/20/18 Baseline Weight: Weight: 66.2 kg Most recent weight: Weight: 70.3 kg     Palliative Assessment/Data:   Flowsheet Rows     Most Recent Value  Intake Tab  Referral Department  Hospitalist  Unit at Time of Referral  Med/Surg Unit  Palliative Care Primary Diagnosis  Other (Comment)  Date Notified  10/19/18  Palliative Care Type  New Palliative care  Reason for referral  Clarify Goals of Care  Date of Admission  10/19/18  Date first seen by Palliative Care  10/20/18  # of days Palliative referral response time  1 Day(s)  # of days IP prior to Palliative referral  0  Clinical Assessment  Palliative Performance Scale Score  40%  Pain Max last 24 hours  Not able to report  Pain Min Last 24 hours  Not able to report  Dyspnea Max Last 24 Hours  Not able to report  Dyspnea Min Last 24 hours  Not able to report   Psychosocial & Spiritual Assessment  Palliative Care Outcomes      Time In: 1450 Time Out: 1540 Time Total: 50 minutes  Greater than 50%  of this time was spent counseling and coordinating care related to the above assessment and plan.  Signed by: Katheran Aweasha A , NP   Please contact Palliative Medicine Team phone at [phone] for questions and concerns.  For individual provider: See Loretha StaplerAmion

## 2018-10-20 NOTE — Progress Notes (Signed)
PHARMACY CONSULT NOTE - FOLLOW UP  Pharmacy Consult for Electrolyte Monitoring and Replacement   Recent Labs: Potassium (mmol/L)  Date Value  10/20/2018 2.6 (LL)   Magnesium (mg/dL)  Date Value  10/20/2018 2.0   Calcium (mg/dL)  Date Value  10/20/2018 8.2 (L)   Albumin (g/dL)  Date Value  10/20/2018 2.2 (L)   Sodium (mmol/L)  Date Value  10/20/2018 138  01/17/2014 142     Assessment: 53 year old female with h/o alcoholic hepatitis per recent hospitalization at [facility]. Patient presented with rectal bleeding. Potassium 2.1 on admission then < 2 with morning labs.  Goal of Therapy:  Electrolytes WNL  Plan:  7/1 am K < 2. Patient received a total of 110 mEq potassium in addition to NS w/ K @ 50 mL/hr.  7/1 pm K 2.6. Will order potassium 40 mEq PO x 1 today at 1500. Patient will also get an additional 40 mEq tonight at 2200 for a total of 80 mEq. NS w/ K continues.  Will order potassium with morning labs.  Pharmacy will continue to follow and replace electrolytes as indicated.  Tawnya Crook ,PharmD Clinical Pharmacist 10/20/2018 2:29 PM

## 2018-10-20 NOTE — Progress Notes (Signed)
NP Seals notified of Lab value of 37.8. No new orders given. Pt A&Ox4. Will continue to monitor

## 2018-10-20 NOTE — Care Management Important Message (Signed)
Important Message  Patient Details  Name: Shelley [name] MRN: [id number] Date of Birth: [birth year]   Medicare Important Message Given:  Yes  Initial Medicare IM given by Patient Access Associate on 10/20/2018 at 1:08pm. Still valid.   Dannette Barbara 10/20/2018, 3:14 PM

## 2018-10-20 NOTE — Significant Event (Signed)
Rapid Response Event Note  Overview: Time Called: 0306 Arrival Time: 0308 Event Type: Hypotension  Initial Focused Assessment: RR called for progressing hypotension after pt had 2 bloody stools once admitted to floor from ER.  Patient is drowsy, arouses to voice, AxO.  SBP 80 upon arrival. Patient does not appear to be in any distress.   Interventions:  Primary care nurse already received orders for 238ml bolus and blood transfusion prior to RR called.  Both are transfusing at this time.   Plan of Care (if not transferred):  Monitor BP after bolus and if remains low, recommend calling MD for another fluid bolus. Advised primary nurse to call ICU if further assistance is needed as well.  Event Summary: Name of Physician Notified: Dr. Sidney Ace at 0300 (prior to RR called)  Outcome: Stayed in room and stabalized  Event End Time: 0317  Shelley [name]

## 2018-10-20 NOTE — Progress Notes (Signed)
[employer] - [place] [facility]   PATIENT NAME: Shelley [name]    MR#:  [id number]  DATE OF BIRTH:  [birth year]  SUBJECTIVE:  CHIEF COMPLAINT:   Chief Complaint  Patient presents with  . Rectal Bleeding  . Jaundice   Patient has some bloody stool and jaundice.  Blood pressure is soft at 80s.  Potassium <2.0 REVIEW OF SYSTEMS:  Review of Systems  Constitutional: Positive for malaise/fatigue. Negative for chills and fever.  HENT: Negative for sore throat.   Eyes: Negative for blurred vision and double vision.  Respiratory: Negative for cough, hemoptysis, shortness of breath, wheezing and stridor.   Cardiovascular: Negative for chest pain, palpitations, orthopnea and leg swelling.  Gastrointestinal: Positive for blood in stool. Negative for abdominal pain, diarrhea, melena, nausea and vomiting.  Genitourinary: Negative for dysuria, flank pain and hematuria.  Musculoskeletal: Negative for back pain and joint pain.  Neurological: Negative for dizziness, sensory change, focal weakness, seizures, loss of consciousness, weakness and headaches.  Endo/Heme/Allergies: Negative for polydipsia.  Psychiatric/Behavioral: Negative for depression. The patient is not nervous/anxious.     DRUG ALLERGIES:  No Known Allergies VITALS:  Blood pressure (!) 80/60, pulse 83, temperature 98.6 F (37 C), temperature source Oral, resp. rate 20, height 5\' 3"  (1.6 m), weight 70.3 kg, SpO2 99 %. PHYSICAL EXAMINATION:  Physical Exam Constitutional:      General: She is not in acute distress. HENT:     Head: Normocephalic.     Mouth/Throat:     Mouth: Mucous membranes are moist.  Eyes:     General: No scleral icterus.    Conjunctiva/sclera: Conjunctivae normal.     Pupils: Pupils are equal, round, and reactive to light.  Neck:     Musculoskeletal: Normal range of motion and neck supple.     Vascular: No JVD.     Trachea: No tracheal deviation.  Cardiovascular:     Rate and  Rhythm: Normal rate and regular rhythm.     Heart sounds: Normal heart sounds. No murmur. No gallop.   Pulmonary:     Effort: Pulmonary effort is normal. No respiratory distress.     Breath sounds: Normal breath sounds. No wheezing or rales.  Abdominal:     General: Bowel sounds are normal. There is no distension.     Palpations: Abdomen is soft.     Tenderness: There is no abdominal tenderness. There is no rebound.  Musculoskeletal: Normal range of motion.        General: No tenderness.     Right lower leg: Edema present.     Left lower leg: Edema present.  Skin:    Coloration: Skin is jaundiced.     Findings: No erythema or rash.  Neurological:     General: No focal deficit present.     Mental Status: She is alert and oriented to person, place, and time.     Cranial Nerves: No cranial nerve deficit.  Psychiatric:        Mood and Affect: Mood normal.    LABORATORY PANEL:  Female CBC Recent Labs  Lab 10/20/18 0146 10/20/18 0636  WBC 5.0  --   HGB 9.3* 9.8*  HCT 26.8* 28.6*  PLT 211  --    ------------------------------------------------------------------------------------------------------------------ Chemistries  Recent Labs  Lab 10/20/18 0146 10/20/18 0636 10/20/18 1357  NA 138  --   --   K <2.0*  --  2.6*  CL 110  --   --   CO2 17*  --   --  GLUCOSE 61*  --   --   BUN 10  --   --   CREATININE UNABLE TO REPORT DUE TO ICTERUS   --   --   CALCIUM 8.2*  --   --   MG 2.0 2.0  --   AST 145*  --   --   ALT 46*  --   --   ALKPHOS 165*  --   --   BILITOT 33.4*  --   --    RADIOLOGY:  [place] Ascites (abdomen Limited)  Result Date: 10/19/2018 CLINICAL DATA:  Cirrhosis, evaluate for ascites EXAM: LIMITED ABDOMEN ULTRASOUND FOR ASCITES TECHNIQUE: Limited ultrasound survey for ascites was performed in all four abdominal quadrants. COMPARISON:  CT 09/22/2018 FINDINGS: Small quantity of ascites within the 4 quadrants of the abdomen without the pocket. Echogenic liver  consistent with hepatocellular disease. IMPRESSION: Small quantity of ascites within the abdomen without drainable fluid pocket. Electronically Signed   By: Donavan Foil M.D.   On: 10/19/2018 22:52   ASSESSMENT AND PLAN:   53 year old female with past medical history of alcohol abuse, PTSD, anxiety, bipolar disorder-recently diagnosed alcoholic liver cirrhosis who presents to the hospital due to rectal bleeding and suspected GI bleed and also noted to be hypokalemic.  1.  GI bleed-patient presents to the hospital with some rectal bleeding.  Patient was recently hospitalized at [facility] and underwent an upper GI endoscopy and colonoscopy.  Patient's colonoscopy showed some hemorrhoids and upper GI endoscopy showed some gastritis/portal gastropathy but no esophageal varices. - Hemoglobin currently stable, will follow serial hemoglobins.  Placed on IV Protonix, follow-up gastroenterology consult.  Placed on clear liquid diet.  2.  Hypokalemia-  potassium supplement orally and intravenously. -Repeat level in the morning, magnesium level is normal.  3.  Hypotension-secondary to underlying chronic liver disease combined with the GI bleed. -Patient is transfused blood and given IV fluids.  Follow hemodynamics.  Patient is clinically asymptomatic.  4.  Alcoholic liver cirrhosis- patient is severely jaundiced her total bilirubin is over 30. - Patient's prognosis is quite poor.    Follow-up palliative care consult discuss goals of care. - No evidence of hepatic encephalopathy.  Continue lactulose.  5.  Depression-continue trazodone.  All the records are reviewed and case discussed with Care Management/Social Worker. Management plans discussed with the patient, family and they are in agreement.  CODE STATUS: DNR  TOTAL TIME TAKING CARE OF THIS PATIENT: 32 minutes.   More than 50% of the time was spent in counseling/coordination of care: YES  POSSIBLE D/C IN 2 DAYS, DEPENDING ON CLINICAL  CONDITION.   Demetrios Loll M.D on 10/20/2018 at 2:37 PM  Between 7am to 6pm - Pager - [phone]  After 6pm go to [email or web address] - [facility]

## 2018-10-20 NOTE — Progress Notes (Signed)
Pt BP increased to 93/58, Pt A&Ox4. Asymptomatic. Will continue to monitor. Other VS stable.

## 2018-10-20 NOTE — Progress Notes (Signed)
NP notified about Low BP 86/47 and Total Bilirubin of 33.4. Pt A&Ox4 Will continue to monitor.

## 2018-10-20 NOTE — Progress Notes (Signed)
Contacted by lab about a critical total bilirubin level of 37.8. Told Kyla Balzarine RN about lab value.

## 2018-10-20 NOTE — Consult Note (Signed)
Vonda Antigua, MD [address], [place], [place], [place], [address] [address], [place], [place], [place], [address] Phone: [phone]  Fax: [phone]  Consultation  Referring Provider:     Dr. Bridgett Larsson Primary Care Physician:  Center, [facility] Reason for Consultation:    Rectal bleeding, jaundice  Date of Admission:  10/19/2018 Date of Consultation:  10/20/2018         HPI:   Shelley [name] is a 53 y.o. female with history of alcohol abuse, recently admitted and extensively evaluated at [facility] for alcoholic hepatitis presents with complaints of rectal bleeding.  Patient states after discharge from [facility] on June 16, initially her rectal bleeding had resolved.  She had initially gone to [facility] due to bright red blood per rectum for which she had complete evaluation over there.  After returning home, she started noticing bright red blood first on the tissue paper only then within the stool itself.  Discharge paperwork from [facility] was reviewed and hemoglobin at the time of discharge was around 9 and that is what it is on admission here.  Patient reports last alcoholic drink was prior to admission to [name].  Patient is visibly jaundiced.  No confusion.  No nausea or vomiting.  No hematemesis.  No abdominal pain at this time.  See paper chart for [facility] records sent over to [place].  As per discharge summary, patient was admitted there with elevated liver enzymes, INR and altered mental status with no evidence of cirrhosis or ascites on imaging.  "Liver ultrasound at [facility] had Doppler with good flow and an enlarged liver with increased parenchymal echogenicity and coarse echotexture that was concerning for steatotic change, no biliary ductal dilation.  The differential included alcoholic hepatitis, Tylenol related liver injury (although Tylenol level within normal limits, status post NAC), DILI (was on Lamictal)... Elevated ferritin, iron sat high at 94%, HFE negative.  Infectious  work-up negative.  Autoimmune work-up negative.  Liver biopsy revealed steatohepatitis.  "It was thought steroids would not provide benefit."  When patient came in bright red blood per rectum, requiring transfusion, hemoglobin was 6.4 on June 5 and 6 0.2 on June 6.  EGD did not show any varices, ulcers, bile acid gastritis was noted.  Colonoscopy showed medium size internal hemorrhoids which were thought to be the source of her bleeding due to her underlying coagulopathy.  Lactulose was started patient has been pancytopenic since admission.   Peripheral smear reveals poor and poor cells but no schistocytes.  Pancytopenia started to resolve on its own and was thought to be due to liver injury and sequestration.  Patient had reported history of bipolar depression and anxiety  Bilirubin at discharge was 25.1, AST 167, ALT 40, alk phos 132, albumin 3  Past Medical History:  Diagnosis Date   Anxiety    Bipolar 1 disorder (HCC)    Chronic kidney disease    Hypertension    PTSD (post-traumatic stress disorder)     Past Surgical History:  Procedure Laterality Date   BLADDER SURGERY     none      Prior to Admission medications   Medication Sig Start Date End Date Taking? Authorizing Provider  lactulose (CHRONULAC) 10 GM/15ML solution TAKE 30 ML BY MOUTH TWICE DAILY 10/06/18  Yes [provider]  Thiamine HCl (VITAMIN B1) 100 MG TABS Take 1 tablet by mouth daily. 10/07/18  Yes [provider]  traZODone (DESYREL) 100 MG tablet Take 1 tablet (100 mg total)  by mouth at bedtime. 04/25/18  Yes Rainey Pines, MD    Family History  Problem Relation Age of Onset   Cancer Mother    Alcohol abuse Mother    Heart attack Father    Cancer Father    Depression Sister    Hypertension Brother    Diabetes Brother    Drug abuse Sister    Alcohol abuse Sister    Obesity Sister    Anxiety disorder Sister    Depression Sister    Hypertension Brother    Depression  Brother    Alcohol abuse Brother      Social History   Tobacco Use   Smoking status: Never Smoker   Smokeless tobacco: Never Used  Substance Use Topics   Alcohol use: No    Alcohol/week: 0.0 standard drinks    Frequency: Never   Drug use: No    Allergies as of 10/19/2018   (No Known Allergies)    Review of Systems:    All systems reviewed and negative except where noted in HPI.   Physical Exam:  Vital signs in last 24 hours: Vitals:   10/20/18 0409 10/20/18 0520 10/20/18 0540 10/20/18 0542  BP: (!) 93/58 99/68 (!) 80/60 (!) 80/60  Pulse: (!) 101 100 91 83  Resp:  16 20   Temp:  98.4 F (36.9 C) 98.6 F (37 C) 98.6 F (37 C)  TempSrc:  Oral Oral Oral  SpO2:  99% 99% 99%  Weight:      Height:       Last BM Date: 10/20/18 General:   Pleasant, cooperative in NAD Head:  Normocephalic and atraumatic. Eyes:   No icterus.   Conjunctiva pink. PERRLA. Ears:  Normal auditory acuity. Neck:  Supple; no masses or thyroidomegaly Lungs: Respirations even and unlabored. Lungs clear to auscultation bilaterally.   No wheezes, crackles, or rhonchi.  Abdomen:  Soft, nondistended, nontender. Normal bowel sounds. No appreciable masses or hepatomegaly.  No rebound or guarding.  Rectal exam: No external hemorrhoids present, no stool in rectal vault, clear yellow output noted at the time of digital rectal exam Neurologic:  Alert and oriented x3;  grossly normal neurologically. Skin:  Intact without significant lesions or rashes, jaundiced. Cervical Nodes:  No significant cervical adenopathy. Psych:  Alert and cooperative. Normal affect.  LAB RESULTS: Recent Labs    10/19/18 1832 10/20/18 0146 10/20/18 0636  WBC 5.1 5.0  --   HGB 9.0* 9.3* 9.8*  HCT 26.5* 26.8* 28.6*  PLT 252 211  --    BMET Recent Labs    10/19/18 1832 10/20/18 0146 10/20/18 1357  NA 138 138  --   K 2.1* <2.0* 2.6*  CL 108 110  --   CO2 18* 17*  --   GLUCOSE 101* 61*  --   BUN 9 10  --     CREATININE UNABLE TO REPORT DUE TO ICTERUS MJU UNABLE TO REPORT DUE TO ICTERUS   --   CALCIUM 8.7* 8.2*  --    LFT Recent Labs    10/20/18 0146  PROT 5.2*  ALBUMIN 2.2*  AST 145*  ALT 46*  ALKPHOS 165*  BILITOT 33.4*   PT/INR Recent Labs    10/19/18 1832  LABPROT 17.5*  INR 1.5*    STUDIES: [place] Ascites (abdomen Limited)  Result Date: 10/19/2018 CLINICAL DATA:  Cirrhosis, evaluate for ascites EXAM: LIMITED ABDOMEN ULTRASOUND FOR ASCITES TECHNIQUE: Limited ultrasound survey for ascites was performed in all four abdominal quadrants. COMPARISON:  CT 09/22/2018 FINDINGS: Small quantity of ascites within the 4 quadrants of the abdomen without the pocket. Echogenic liver consistent with hepatocellular disease. IMPRESSION: Small quantity of ascites within the abdomen without drainable fluid pocket. Electronically Signed   By: Donavan Foil M.D.   On: 10/19/2018 22:52      Impression / Plan:   Shelley [name] is a 53 y.o. y/o female with history of alcohol abuse, recently discharged from [facility] on June 16 for alcoholic hepatitis and bright red blood per rectum with EGD and colonoscopy revealing internal hemorrhoids only as the source of her rectal bleeding in the setting of coagulopathy due to alcoholic hepatitis  Patient denies drinking after discharge from [facility]  However, [facility] paperwork does reveal that she initially declined drinking completely at the time of admission and then later admitted to it  Hard to say if she has been drinking after hospital discharge  Patient bilirubin has now peaked at 37.8 yesterday and has declined to 33.4 today  It is now expected to continue to decline given that it peaked yesterday  She has had significant work-up for this already and does not need any imaging at this time  As long as bilirubin continues to decline, since patient has had a alcoholic hepatitis for about a month now, would not recommend steroids at this time.  Her rectal exam today  did not reveal any blood  She had internal hemorrhoids known to be the source of her rectal bleeding at the time of [facility] admission in the setting of coagulopathy from liver injury.  She does not need repeat colonoscopy at this time given complete work-up with EGD and colonoscopy within the last month, stable hemoglobin from hospital discharge at [facility], and negative rectal exam today  Okay to DC octreotide since no varices were noted on recent EGD  Continue folate and thiamine  Continue daily CBCs and transfuse PRN  Please page GI with any signs of active GI bleeding  Patient is on PPI twice daily.  Okay to continue for now and change to p.o. in 1 to 2 days if no further signs of active GI bleeding.  Alcohol abstinence encouraged  Patient will need close follow-up at hospital discharge  Dr. Allen Norris will be on service tomorrow and will be following the patient  Thank you for involving me in the care of this patient.      LOS: 1 day   Virgel Manifold, MD  10/20/2018, 3:06 PM

## 2018-10-21 ENCOUNTER — Encounter: Payer: Self-pay | Admitting: Radiology

## 2018-10-21 ENCOUNTER — Inpatient Hospital Stay: Payer: Medicare Other

## 2018-10-21 DIAGNOSIS — K7011 Alcoholic hepatitis with ascites: Secondary | ICD-10-CM

## 2018-10-21 LAB — POTASSIUM: Potassium: 3 mmol/L — ABNORMAL LOW (ref 3.5–5.1)

## 2018-10-21 LAB — BPAM RBC
Blood Product Expiration Date: 202007132359
Blood Product Expiration Date: 202007222359
Blood Product Expiration Date: 202007272359
Blood Product Expiration Date: 202007292359
ISSUE DATE / TIME: 202007012125
ISSUE DATE / TIME: 202007020227
Unit Type and Rh: 600
Unit Type and Rh: 600
Unit Type and Rh: 600
Unit Type and Rh: 600

## 2018-10-21 LAB — TYPE AND SCREEN
ABO/RH(D): A NEG
Antibody Screen: NEGATIVE
Unit division: 0
Unit division: 0
Unit division: 0
Unit division: 0

## 2018-10-21 LAB — HEMOGLOBIN AND HEMATOCRIT, BLOOD
HCT: 31.4 % — ABNORMAL LOW (ref 36.0–46.0)
Hemoglobin: 10.7 g/dL — ABNORMAL LOW (ref 12.0–15.0)

## 2018-10-21 LAB — HEMOGLOBIN
Hemoglobin: 9.7 g/dL — ABNORMAL LOW (ref 12.0–15.0)
Hemoglobin: 11 g/dL — ABNORMAL LOW (ref 12.0–15.0)

## 2018-10-21 MED ORDER — POTASSIUM CHLORIDE CRYS ER 20 MEQ PO TBCR
40.0000 meq | EXTENDED_RELEASE_TABLET | Freq: Once | ORAL | Status: AC
  Administered 2018-10-21: 40 meq via ORAL
  Filled 2018-10-21: qty 2

## 2018-10-21 MED ORDER — TECHNETIUM TC 99M-LABELED RED BLOOD CELLS IV KIT
20.0000 | PACK | Freq: Once | INTRAVENOUS | Status: AC | PRN
  Administered 2018-10-21: 22.813 via INTRAVENOUS

## 2018-10-21 MED ORDER — PANTOPRAZOLE SODIUM 40 MG PO TBEC
40.0000 mg | DELAYED_RELEASE_TABLET | Freq: Two times a day (BID) | ORAL | Status: DC
  Administered 2018-10-21 – 2018-10-24 (×8): 40 mg via ORAL
  Filled 2018-10-21 (×7): qty 1

## 2018-10-21 MED ORDER — POTASSIUM CHLORIDE CRYS ER 20 MEQ PO TBCR
20.0000 meq | EXTENDED_RELEASE_TABLET | Freq: Once | ORAL | Status: AC
  Administered 2018-10-21: 15:00:00 20 meq via ORAL
  Filled 2018-10-21: qty 1

## 2018-10-21 NOTE — Progress Notes (Signed)
Midge Miniumarren Bret Vanessen, MD [facility]   [address] [place], [place] [address] Phone: [phone] Fax : [phone]   Subjective: Please see consult note from yesterday. The patient's hemoglobin is now stable.  She is unable to answer any questions and appears lethargic.  Patient has had a work-up for GI bleed and discharged with a similar hemoglobin.   Objective: Vital signs in last 24 hours: Vitals:   10/20/18 2126 10/20/18 2205 10/21/18 0119 10/21/18 0540  BP: (!) 85/61 95/64 101/62 95/67  Pulse: 89 92 79 93  Resp:   19   Temp:   98.8 F (37.1 C) 98.7 F (37.1 C)  TempSrc:   Oral Oral  SpO2:   100% 98%  Weight:      Height:       Weight change:   Intake/Output Summary (Last 24 hours) at 10/21/2018 1229 Last data filed at 10/21/2018 0400 Gross per 24 hour  Intake 1394.08 ml  Output 100 ml  Net 1294.08 ml     Exam: Heart:: Regular rate and rhythm, S1S2 present or without murmur or extra heart sounds Lungs: normal and clear to auscultation and percussion Abdomen: Soft nontender nondistended   Lab Results: @LABTEST2 @ Micro Results: Recent Results (from the past 240 hour(s))  SARS Coronavirus 2 (CEPHEID- Performed in [facility] lab), Hosp Order     Status: None   Collection Time: 10/19/18  6:39 PM   Specimen: Nasopharyngeal Swab  Result Value Ref Range Status   SARS Coronavirus 2 NEGATIVE NEGATIVE Final    Comment: (NOTE) If result is NEGATIVE SARS-CoV-2 target nucleic acids are NOT DETECTED. The SARS-CoV-2 RNA is generally detectable in upper and lower  respiratory specimens during the acute phase of infection. The lowest  concentration of SARS-CoV-2 viral copies this assay can detect is 250  copies / mL. A negative result does not preclude SARS-CoV-2 infection  and should not be used as the sole basis for treatment or other  patient management decisions.  A negative result may occur with  improper specimen collection / handling, submission of specimen  other  than nasopharyngeal swab, presence of viral mutation(s) within the  areas targeted by this assay, and inadequate number of viral copies  (<250 copies / mL). A negative result must be combined with clinical  observations, patient history, and epidemiological information. If result is POSITIVE SARS-CoV-2 target nucleic acids are DETECTED. The SARS-CoV-2 RNA is generally detectable in upper and lower  respiratory specimens dur ing the acute phase of infection.  Positive  results are indicative of active infection with SARS-CoV-2.  Clinical  correlation with patient history and other diagnostic information is  necessary to determine patient infection status.  Positive results do  not rule out bacterial infection or co-infection with other viruses. If result is PRESUMPTIVE POSTIVE SARS-CoV-2 nucleic acids MAY BE PRESENT.   A presumptive positive result was obtained on the submitted specimen  and confirmed on repeat testing.  While 2019 novel coronavirus  (SARS-CoV-2) nucleic acids may be present in the submitted sample  additional confirmatory testing may be necessary for epidemiological  and / or clinical management purposes  to differentiate between  SARS-CoV-2 and other Sarbecovirus currently known to infect humans.  If clinically indicated additional testing with an alternate test  methodology [phone]([id number]) is advised. The SARS-CoV-2 RNA is generally  detectable in upper and lower respiratory sp ecimens during the acute  phase of infection. The expected result is Negative. Fact Sheet for Patients:  BoilerBrush.com.cyhttps://www.fda.gov/media/136312/download Fact Sheet  for Healthcare Providers: BankingDealers.co.za This test is not yet approved or cleared by the [place] and has been authorized for detection and/or diagnosis of SARS-CoV-2 by FDA under an Emergency Use Authorization (EUA).  This EUA will remain in effect (meaning this test can be used) for the duration  of the COVID-19 declaration under Section 564(b)(1) of the Act, 21 U.S.C. section 360bbb-3(b)(1), unless the authorization is terminated or revoked sooner. Performed at [facility], [address] [place], [place] [address]    Studies/Results: [place] Ascites (abdomen Limited)  Result Date: 10/19/2018 CLINICAL DATA:  Cirrhosis, evaluate for ascites EXAM: LIMITED ABDOMEN ULTRASOUND FOR ASCITES TECHNIQUE: Limited ultrasound survey for ascites was performed in all four abdominal quadrants. COMPARISON:  CT 09/22/2018 FINDINGS: Small quantity of ascites within the 4 quadrants of the abdomen without the pocket. Echogenic liver consistent with hepatocellular disease. IMPRESSION: Small quantity of ascites within the abdomen without drainable fluid pocket. Electronically Signed   By: Donavan Foil M.D.   On: 10/19/2018 22:52   Medications: I have reviewed the patient's current medications. Scheduled Meds: . lactulose  30 g Oral BID  . pantoprazole  40 mg Oral BID AC  . potassium chloride  20 mEq Oral Once  . thiamine  100 mg Oral Daily  . traZODone  100 mg Oral QHS   Continuous Infusions: PRN Meds:.acetaminophen **OR** acetaminophen, ondansetron **OR** ondansetron (ZOFRAN) IV   Assessment: Active Problems:   Bright red blood per rectum   Alcoholic cirrhosis (HCC)   Goals of care, counseling/discussion   Palliative care by specialist    Plan: This patient came in with alcoholic hepatitis and a presumed GI bleed.  The patient has not had repeat liver enzymes.  The patient had a recent EGD and colonoscopy without any esophageal varices found.  The patient was lethargic and unable to answer any questions today.  Her total bilirubin was 37.8 yesterday.  The patient's imaging showed severe hepatic steatosis with hepatomegaly without signs of cirrhosis.  The patient should continue to avoid alcohol on discharge nothing further to add from a GI point of view.   LOS: 2 days   Lucilla Lame  10/21/2018, 12:29 PM

## 2018-10-21 NOTE — Progress Notes (Signed)
[employer] [facility] - [place] at [facility]   PATIENT NAME: Shelley [name]    MR#:  [id number]  DATE OF BIRTH:  [birth year]  SUBJECTIVE:  CHIEF COMPLAINT:   Chief Complaint  Patient presents with   Rectal Bleeding   Jaundice   Patient has no bloody stool. REVIEW OF SYSTEMS:  Review of Systems  Constitutional: Positive for malaise/fatigue. Negative for chills and fever.  HENT: Negative for sore throat.   Eyes: Negative for blurred vision and double vision.  Respiratory: Negative for cough, hemoptysis, shortness of breath, wheezing and stridor.   Cardiovascular: Negative for chest pain, palpitations, orthopnea and leg swelling.  Gastrointestinal: Negative for abdominal pain, blood in stool, diarrhea, melena, nausea and vomiting.  Genitourinary: Negative for dysuria, flank pain and hematuria.  Musculoskeletal: Negative for back pain and joint pain.  Neurological: Negative for dizziness, sensory change, focal weakness, seizures, loss of consciousness, weakness and headaches.  Endo/Heme/Allergies: Negative for polydipsia.  Psychiatric/Behavioral: Negative for depression. The patient is not nervous/anxious.     DRUG ALLERGIES:  No Known Allergies VITALS:  Blood pressure 95/67, pulse 93, temperature 98.7 F (37.1 C), temperature source Oral, resp. rate 19, height 5\' 3"  (1.6 m), weight 70.3 kg, SpO2 98 %. PHYSICAL EXAMINATION:  Physical Exam Constitutional:      General: She is not in acute distress. HENT:     Head: Normocephalic.     Mouth/Throat:     Mouth: Mucous membranes are moist.  Eyes:     General: No scleral icterus.    Conjunctiva/sclera: Conjunctivae normal.     Pupils: Pupils are equal, round, and reactive to light.  Neck:     Musculoskeletal: Normal range of motion and neck supple.     Vascular: No JVD.     Trachea: No tracheal deviation.  Cardiovascular:     Rate and Rhythm: Normal rate and regular rhythm.     Heart sounds: Normal heart sounds.  No murmur. No gallop.   Pulmonary:     Effort: Pulmonary effort is normal. No respiratory distress.     Breath sounds: Normal breath sounds. No wheezing or rales.  Abdominal:     General: Bowel sounds are normal. There is no distension.     Palpations: Abdomen is soft.     Tenderness: There is no abdominal tenderness. There is no rebound.  Musculoskeletal: Normal range of motion.        General: No tenderness.     Right lower leg: Edema present.     Left lower leg: Edema present.  Skin:    Coloration: Skin is jaundiced.     Findings: No erythema or rash.  Neurological:     General: No focal deficit present.     Mental Status: She is alert and oriented to person, place, and time.     Cranial Nerves: No cranial nerve deficit.  Psychiatric:     Comments: Looks drowsy.    LABORATORY PANEL:  Female CBC Recent Labs  Lab 10/20/18 0146 10/20/18 0636 10/21/18 0536  WBC 5.0  --   --   HGB 9.3* 9.8* 9.7*  HCT 26.8* 28.6*  --   PLT 211  --   --    ------------------------------------------------------------------------------------------------------------------ Chemistries  Recent Labs  Lab 10/20/18 0146 10/20/18 0636  10/21/18 0536  NA 138  --   --   --   K <2.0*  --    < > 3.0*  CL 110  --   --   --  CO2 17*  --   --   --   GLUCOSE 61*  --   --   --   BUN 10  --   --   --   CREATININE UNABLE TO REPORT DUE TO ICTERUS   --   --   --   CALCIUM 8.2*  --   --   --   MG 2.0 2.0  --   --   AST 145*  --   --   --   ALT 46*  --   --   --   ALKPHOS 165*  --   --   --   BILITOT 33.4*  --   --   --    < > = values in this interval not displayed.   RADIOLOGY:  No results found. ASSESSMENT AND PLAN:   53 year old female with past medical history of alcohol abuse, PTSD, anxiety, bipolar disorder-recently diagnosed alcoholic liver cirrhosis who presents to the hospital due to rectal bleeding and suspected GI bleed and also noted to be hypokalemic.  1.  GI bleed-patient presents  to the hospital with some rectal bleeding.  Patient was recently hospitalized at [facility] and underwent an upper GI endoscopy and colonoscopy.  Patient's colonoscopy showed some hemorrhoids and upper GI endoscopy showed some gastritis/portal gastropathy but no esophageal varices. - Hemoglobin currently stable, will follow serial hemoglobins.  Placed on IV Protonix,. No active bleeding, hemoglobin is stable.  Advance to soft diet.  Change to p.o. Protonix.  2.  Hypokalemia-  potassium supplement orally and intravenously. Calcium still low at 3.0.  Continue potassium supplement.  3.  Hypotension-secondary to underlying chronic liver disease combined with the GI bleed. -Patient is transfused blood and given IV fluids.  Follow hemodynamics.  Patient is clinically asymptomatic.  4.  Alcoholic liver cirrhosis- patient is severely jaundiced her total bilirubin is over 30. - Patient's prognosis is quite poor.    Follow-up palliative care consult discuss goals of care. - No evidence of hepatic encephalopathy.  Continue lactulose.  She needs outpatient palliative care follow-up.  5.  Depression-continue trazodone.  All the records are reviewed and case discussed with Care Management/Social Worker. Management plans discussed with the patient, her spouse and they are in agreement.  CODE STATUS: DNR  TOTAL TIME TAKING CARE OF THIS PATIENT: 33 minutes.   More than 50% of the time was spent in counseling/coordination of care: YES  POSSIBLE D/C IN 2 DAYS, DEPENDING ON CLINICAL CONDITION.   Demetrios Loll M.D on 10/21/2018 at 1:45 PM  Between 7am to 6pm - Pager - [phone]  After 6pm go to [email or web address] - [facility]

## 2018-10-21 NOTE — Consult Note (Signed)
Subjective:  CC: BRBPR  HPI:  Shelley [name] is a 53 y.o. female who was consulted by Bridgett Larsson for evaluation of hemorrhoids.  Patient recently admitted for GI bleeding, and liver cirrhosis.  Nursing reported increasing bright red blood per rectum, covering the bottom of the commode, and worsening hemorrhoids on exam.    Patient cannot recall at the time of exam how long she has been dealing with hemorrhoids and the recent GI bleed.  She is unaware that she is having frequent stooling currently.  However, she is alert and oriented x2, not able to recall what specific day it is.  Otherwise she was answering questions appropriately.  No report of recent alcohol use but significant use in the past, which is likely cause of her liver cirrhosis.   Past Medical History:  has a past medical history of Anxiety, Bipolar 1 disorder ([place]), Chronic kidney disease, Hypertension, and PTSD (post-traumatic stress disorder).  Past Surgical History:  has a past surgical history that includes Bladder surgery.  Family History: family history includes Alcohol abuse in her brother, mother, and sister; Anxiety disorder in her sister; Cancer in her father and mother; Depression in her brother, sister, and sister; Diabetes in her brother; Drug abuse in her sister; Heart attack in her father; Hypertension in her brother and brother; Obesity in her sister.  Social History:  reports that she has never smoked. She has never used smokeless tobacco. She reports that she does not drink alcohol or use drugs.  Current Medications:  Medications Prior to Admission  Medication Sig Dispense Refill  . lactulose (CHRONULAC) 10 GM/15ML solution TAKE 30 ML BY MOUTH TWICE DAILY    . Thiamine HCl (VITAMIN B1) 100 MG TABS Take 1 tablet by mouth daily.    . traZODone (DESYREL) 100 MG tablet Take 1 tablet (100 mg total) by mouth at bedtime. 90 tablet 1    Allergies:  Allergies as of 10/19/2018  . (No Known Allergies)    ROS:   General: Denies weight loss, weight gain, fatigue, fevers, chills, and night sweats. Eyes: Denies blurry vision, double vision, eye pain, itchy eyes, and tearing. Ears: Denies hearing loss, earache, and ringing in ears. Nose: Denies sinus pain, congestion, infections, runny nose, and nosebleeds. Mouth/throat: Denies hoarseness, sore throat, bleeding gums, and difficulty swallowing. Heart: Denies chest pain, palpitations, racing heart, irregular heartbeat, leg pain or swelling, and decreased activity tolerance. Respiratory: Denies breathing difficulty, shortness of breath, wheezing, cough, and sputum. GI: Denies change in appetite, heartburn, nausea, vomiting, constipation, diarrhea.  GU: Denies difficulty urinating, pain with urinating, urgency, frequency, blood in urine. Musculoskeletal: Denies joint stiffness, pain, swelling, muscle weakness. Skin: Denies rash, itching, mass, tumors, sores, and boils Neurologic: Denies headache, fainting, dizziness, seizures, numbness, and tingling. Psychiatric: Denies depression, anxiety, difficulty sleeping, and memory loss. Endocrine: Denies heat or cold intolerance, and increased thirst or urination. Blood/lymph: denies easy bruising, and swollen glands    Objective:     BP 100/63 (BP Location: Left Arm)   Pulse 89   Temp 98.3 F (36.8 C) (Oral)   Resp 18   Ht 5\' 3"  (1.6 m)   Wt 70.3 kg   SpO2 100%   BMI 27.46 kg/m   Constitutional :  alert, cooperative, appears stated age, icteric, no distress and slowed mentation  Lymphatics/Throat::  no asymmetry, masses, or scars  Respiratory:  clear to auscultation bilaterally  Cardiovascular:  regular rate and rhythm  Gastrointestinal: distended but soft abdomen, no TTP.   Musculoskeletal:  Steady gait and movement  Skin: Cool and moist, generalized jaundice  Psychiatric: Normal affect, non-agitated, not confused  Genital/Rectal: Chaperone present for exam.  Chaperone present for exam.  External  exam noted to have several external hemorrhoidal skin tags, with one larger one that is non-tender, no ulceration on right lateral aspect.  Superficial skin breakdown noted from frequent stooling. DRE revealed, somewhat relaxed rectal tone, with palpable internal hemorrhoid noted at left lateral portion with minimal discomfort.  After obtaining verbal consent, an anoscope was inserted after prepped with lubricant and internal hemorrhoids noted on DRE confirmed visually, with no evidence of thrombosis, ulceration to indicate recent bleeding.  No fresh blood with rectal vault at time of exam.   No other pathology such as fissures, fistulas, polyps noted.  Scope withdrawn and patient tolerate procedure well.     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  she has some external skin tags, with some skin irritation, but cannot account for the reported amount of bleeding.  I agree with bleeding scan if she continues to have episodes, in addition to tracking her INR and addressing it as needed.  No surgical intervention at this time, since no clear source of GI bleed, and she is high risk for perioperative complications secondary to her liver disease.  Call/chat with any additional questions.

## 2018-10-21 NOTE — Progress Notes (Signed)
Palliative: Shelley [name], [place], is resting quietly in bed.  She greets me making and mostly keeping eye contact.  She appears quite jaundiced, acutely ill and frail.  There is no family at bedside at this time due to visitor restrictions.  We talk about her labs, nutrition and disease process.  Plan for hepatic panel tomorrow.  We discuss the benefits of out patient palliative care, and [name] agrees to outpatient palliative services.  We also discuss Hospice care for "treat the treatable" care.   Plan:  24-48 hours for outcomes.  Home with Palliative outpatient. We discuss Hospice care, also.  PMT to follow.   79 minutes Shelley Axe, NP Palliative Medicine Team Team Phone # [phone] Greater than 50% of this time was spent counseling and coordinating care related to the above assessment and plan.

## 2018-10-21 NOTE — Progress Notes (Signed)
PHARMACY CONSULT NOTE - FOLLOW UP  Pharmacy Consult for Electrolyte Monitoring and Replacement   Recent Labs: Potassium (mmol/L)  Date Value  10/21/2018 3.0 (L)   Magnesium (mg/dL)  Date Value  10/20/2018 2.0   Calcium (mg/dL)  Date Value  10/20/2018 8.2 (L)   Albumin (g/dL)  Date Value  10/20/2018 2.2 (L)   Sodium (mmol/L)  Date Value  10/20/2018 138  01/17/2014 142     Assessment: 53 year old female with h/o alcoholic hepatitis per recent hospitalization at [facility]. Patient presented with rectal bleeding. Potassium 2.1 on admission then < 2 with morning labs on 7/2.  Goal of Therapy:  Electrolytes WNL  Plan:  K 3.0 NS w/ K has been discontinued. Potassium improved from 2.6 to 3 with 80 mEq potassium supplementation yesterday. Patient has an order for potassium 40 mEq PO BID for today. Will order additional 20 mEq PO x 1 to be given this afternoon. This will give the patient a total of 100 mEq potassium today.  Will order potassium with morning labs.  Pharmacy will continue to follow and replace electrolytes as indicated.  Tawnya Crook ,PharmD Clinical Pharmacist 10/21/2018 8:50 AM

## 2018-10-22 DIAGNOSIS — K625 Hemorrhage of anus and rectum: Secondary | ICD-10-CM

## 2018-10-22 LAB — BASIC METABOLIC PANEL
Anion gap: 9 (ref 5–15)
BUN: 6 mg/dL (ref 6–20)
CO2: 13 mmol/L — ABNORMAL LOW (ref 22–32)
Calcium: 8.3 mg/dL — ABNORMAL LOW (ref 8.9–10.3)
Chloride: 121 mmol/L — ABNORMAL HIGH (ref 98–111)
Creatinine, Ser: UNDETERMINED mg/dL (ref 0.44–1.00)
Glucose, Bld: 114 mg/dL — ABNORMAL HIGH (ref 70–99)
Potassium: 2.8 mmol/L — ABNORMAL LOW (ref 3.5–5.1)
Sodium: 143 mmol/L (ref 135–145)

## 2018-10-22 LAB — HEPATIC FUNCTION PANEL
ALT: 61 U/L — ABNORMAL HIGH (ref 0–44)
AST: 167 U/L — ABNORMAL HIGH (ref 15–41)
Albumin: 2 g/dL — ABNORMAL LOW (ref 3.5–5.0)
Alkaline Phosphatase: 164 U/L — ABNORMAL HIGH (ref 38–126)
Bilirubin, Direct: 18.6 mg/dL — ABNORMAL HIGH (ref 0.0–0.2)
Indirect Bilirubin: 14.5 mg/dL — ABNORMAL HIGH (ref 0.3–0.9)
Total Bilirubin: 33.1 mg/dL (ref 0.3–1.2)
Total Protein: 5 g/dL — ABNORMAL LOW (ref 6.5–8.1)

## 2018-10-22 LAB — HEMOGLOBIN: Hemoglobin: 10.3 g/dL — ABNORMAL LOW (ref 12.0–15.0)

## 2018-10-22 LAB — HIV ANTIBODY (ROUTINE TESTING W REFLEX): HIV Screen 4th Generation wRfx: NONREACTIVE

## 2018-10-22 MED ORDER — POTASSIUM CHLORIDE 20 MEQ PO PACK
40.0000 meq | PACK | Freq: Three times a day (TID) | ORAL | Status: AC
  Administered 2018-10-22 (×3): 40 meq via ORAL
  Filled 2018-10-22 (×3): qty 2

## 2018-10-22 MED ORDER — DEXTROSE-NACL 5-0.9 % IV SOLN
INTRAVENOUS | Status: DC
  Administered 2018-10-22 – 2018-10-25 (×4): via INTRAVENOUS

## 2018-10-22 NOTE — Progress Notes (Signed)
[employer] - [place] [facility]   PATIENT NAME: Shelley [name]    MR#:  [id number]  DATE OF BIRTH:  1966/04/01  SUBJECTIVE:  CHIEF COMPLAINT:   Chief Complaint  Patient presents with  . Rectal Bleeding  . Jaundice   Patient has no bloody stool.  She has no complaints except generalized weakness. REVIEW OF SYSTEMS:  Review of Systems  Constitutional: Positive for malaise/fatigue. Negative for chills and fever.  HENT: Negative for sore throat.   Eyes: Negative for blurred vision and double vision.  Respiratory: Negative for cough, hemoptysis, shortness of breath, wheezing and stridor.   Cardiovascular: Negative for chest pain, palpitations, orthopnea and leg swelling.  Gastrointestinal: Negative for abdominal pain, blood in stool, diarrhea, melena, nausea and vomiting.  Genitourinary: Negative for dysuria, flank pain and hematuria.  Musculoskeletal: Negative for back pain and joint pain.  Neurological: Negative for dizziness, sensory change, focal weakness, seizures, loss of consciousness, weakness and headaches.  Endo/Heme/Allergies: Negative for polydipsia.  Psychiatric/Behavioral: Negative for depression. The patient is not nervous/anxious.     DRUG ALLERGIES:  No Known Allergies VITALS:  Blood pressure 97/80, pulse 79, temperature 98.1 F (36.7 C), resp. rate 18, height 5\' 3"  (1.6 m), weight 70.3 kg, SpO2 100 %. PHYSICAL EXAMINATION:  Physical Exam Constitutional:      General: She is not in acute distress. HENT:     Head: Normocephalic.     Mouth/Throat:     Mouth: Mucous membranes are moist.  Eyes:     General: No scleral icterus.    Conjunctiva/sclera: Conjunctivae normal.     Pupils: Pupils are equal, round, and reactive to light.  Neck:     Musculoskeletal: Normal range of motion and neck supple.     Vascular: No JVD.     Trachea: No tracheal deviation.  Cardiovascular:     Rate and Rhythm: Normal rate and regular rhythm.     Heart  sounds: Normal heart sounds. No murmur. No gallop.   Pulmonary:     Effort: Pulmonary effort is normal. No respiratory distress.     Breath sounds: Normal breath sounds. No wheezing or rales.  Abdominal:     General: Bowel sounds are normal. There is no distension.     Palpations: Abdomen is soft.     Tenderness: There is no abdominal tenderness. There is no rebound.  Musculoskeletal: Normal range of motion.        General: No tenderness.     Right lower leg: Edema present.     Left lower leg: Edema present.  Skin:    Coloration: Skin is jaundiced.     Findings: No erythema or rash.  Neurological:     General: No focal deficit present.     Mental Status: She is alert and oriented to person, place, and time.     Cranial Nerves: No cranial nerve deficit.  Psychiatric:     Comments: Looks drowsy.    LABORATORY PANEL:  Female CBC Recent Labs  Lab 10/20/18 0146  10/21/18 2137 10/22/18 0615  WBC 5.0  --   --   --   HGB 9.3*   < > 10.7* 10.3*  HCT 26.8*   < > 31.4*  --   PLT 211  --   --   --    < > = values in this interval not displayed.   ------------------------------------------------------------------------------------------------------------------ Chemistries  Recent Labs  Lab 10/20/18 0636  10/22/18 0615  NA  --   --  143  K  --    < > 2.8*  CL  --   --  121*  CO2  --   --  13*  GLUCOSE  --   --  114*  BUN  --   --  6  CREATININE  --   --  PENDING  CALCIUM  --   --  8.3*  MG 2.0  --   --   AST  --   --  167*  ALT  --   --  61*  ALKPHOS  --   --  164*  BILITOT  --   --  33.1*   < > = values in this interval not displayed.   RADIOLOGY:  Nm Gi Blood Loss  Result Date: 10/21/2018 CLINICAL DATA:  GI bleed. EXAM: NUCLEAR MEDICINE GASTROINTESTINAL BLEEDING SCAN TECHNIQUE: Sequential abdominal images were obtained following intravenous administration of Tc-73m labeled red blood cells. RADIOPHARMACEUTICALS:  22.813 mCi Tc-81m pertechnetate in-vitro labeled red cells.  COMPARISON:  None. FINDINGS: No active source of GI bleed identified. There is activity in the lower pelvis from the beginning of the study which persists throughout the study, not consistent with an active GI bleed. IMPRESSION: No active GI bleed identified. Electronically Signed   By: Dorise Bullion III M.D   On: 10/21/2018 20:59   ASSESSMENT AND PLAN:   53 year old female with past medical history of alcohol abuse, PTSD, anxiety, bipolar disorder-recently diagnosed alcoholic liver cirrhosis who presents to the hospital due to rectal bleeding and suspected GI bleed and also noted to be hypokalemic.  1.  GI bleed-patient presents to the hospital with some rectal bleeding.  Patient was recently hospitalized at [facility] and underwent an upper GI endoscopy and colonoscopy.  Patient's colonoscopy showed some hemorrhoids and upper GI endoscopy showed some gastritis/portal gastropathy but no esophageal varices. - Hemoglobin currently stable, will follow serial hemoglobins.  Placed on IV Protonix,. Advanced to soft diet.  Changed to p.o. Protonix. The patient had one episode of rectal bleeding yesterday.  No surgical indication per Dr. Lysle Pearl.  Bleeding scan is normal.  Hemoglobin is stable.  No active bleeding.  2.  Hypokalemia-  potassium supplement orally and intravenously. Calcium still low at 2.8.  Continue potassium supplement.  3.  Hypotension-secondary to underlying chronic liver disease combined with the GI bleed. -Patient is transfused blood and given IV fluids.  Follow hemodynamics.  Patient is clinically asymptomatic.  4.  Alcoholic liver cirrhosis- patient is severely jaundiced her total bilirubin is over 30. - Patient's prognosis is quite poor.    Follow-up palliative care consult discuss goals of care. - No evidence of hepatic encephalopathy.  Continue lactulose.  She needs outpatient palliative care follow-up.  No improvement of liver function test.  5.  Depression-continue  trazodone.  All the records are reviewed and case discussed with Care Management/Social Worker. Management plans discussed with the patient, her spouse and they are in agreement.  CODE STATUS: DNR  TOTAL TIME TAKING CARE OF THIS PATIENT: 27 minutes.   More than 50% of the time was spent in counseling/coordination of care: YES  POSSIBLE D/C IN 2 DAYS, DEPENDING ON CLINICAL CONDITION.   Demetrios Loll M.D on 10/22/2018 at 12:25 PM  Between 7am to 6pm - Pager - [phone]  After 6pm go to [email or web address] - [facility]

## 2018-10-22 NOTE — Progress Notes (Signed)
PHARMACY CONSULT NOTE - FOLLOW UP  Pharmacy Consult for Electrolyte Monitoring and Replacement   Recent Labs: Potassium (mmol/L)  Date Value  10/22/2018 2.8 (L)   Magnesium (mg/dL)  Date Value  10/20/2018 2.0   Calcium (mg/dL)  Date Value  10/22/2018 8.3 (L)   Albumin (g/dL)  Date Value  10/22/2018 2.0 (L)   Sodium (mmol/L)  Date Value  10/22/2018 143  01/17/2014 142     Assessment: 53 year old female with h/o alcoholic hepatitis per recent hospitalization at [facility]. Patient presented with rectal bleeding. Potassium 2.1 on admission then < 2 with morning labs on 7/2. She received a total of 60 mEq oral KCl on 7/3  Goal of Therapy:  Electrolytes WNL  Plan:   Will order 40 mEq PO x 3 today   Will order potassium and magnesium with morning labs.  Pharmacy will continue to follow and replace electrolytes as indicated.  Dallie Piles ,PharmD Clinical Pharmacist 10/22/2018 10:39 AM

## 2018-10-22 NOTE — Progress Notes (Signed)
Midge Miniumarren Lila Lufkin, MD [facility]   [address] [place], [place] [address] Phone: [phone] Fax : [phone]   Subjective: This patient has severe alcoholic hepatitis and rectal bleeding.  The patient had a work-up recently at [facility] which included a flexible sigmoidoscopy, colonoscopy and upper endoscopy without any source of GI bleeding.  The patient was reported to have repeat bleeding yesterday and had a bleeding scan that was negative.  The patient was seen by surgery yesterday.   Objective: Vital signs in last 24 hours: Vitals:   10/21/18 2011 10/21/18 2027 10/21/18 2054 10/22/18 0532  BP: (!) 91/53 94/60 (!) 89/60 97/80  Pulse:   85 79  Resp:   18   Temp:   98.6 F (37 C) 98.1 F (36.7 C)  TempSrc:   Oral   SpO2:   99% 100%  Weight:      Height:       Weight change:   Intake/Output Summary (Last 24 hours) at 10/22/2018 1117 Last data filed at 10/21/2018 1300 Gross per 24 hour  Intake 240 ml  Output -  Net 240 ml     Exam: Heart:: Regular rate and rhythm, S1S2 present or without murmur or extra heart sounds Lungs: normal and clear to auscultation and percussion Abdomen: soft, nontender, normal bowel sounds   Lab Results: @LABTEST2 @ Micro Results: Recent Results (from the past 240 hour(s))  SARS Coronavirus 2 (CEPHEID- Performed in [facility] lab), Hosp Order     Status: None   Collection Time: 10/19/18  6:39 PM   Specimen: Nasopharyngeal Swab  Result Value Ref Range Status   SARS Coronavirus 2 NEGATIVE NEGATIVE Final    Comment: (NOTE) If result is NEGATIVE SARS-CoV-2 target nucleic acids are NOT DETECTED. The SARS-CoV-2 RNA is generally detectable in upper and lower  respiratory specimens during the acute phase of infection. The lowest  concentration of SARS-CoV-2 viral copies this assay can detect is 250  copies / mL. A negative result does not preclude SARS-CoV-2 infection  and should not be used as the sole basis for treatment or other   patient management decisions.  A negative result may occur with  improper specimen collection / handling, submission of specimen other  than nasopharyngeal swab, presence of viral mutation(s) within the  areas targeted by this assay, and inadequate number of viral copies  (<250 copies / mL). A negative result must be combined with clinical  observations, patient history, and epidemiological information. If result is POSITIVE SARS-CoV-2 target nucleic acids are DETECTED. The SARS-CoV-2 RNA is generally detectable in upper and lower  respiratory specimens dur ing the acute phase of infection.  Positive  results are indicative of active infection with SARS-CoV-2.  Clinical  correlation with patient history and other diagnostic information is  necessary to determine patient infection status.  Positive results do  not rule out bacterial infection or co-infection with other viruses. If result is PRESUMPTIVE POSTIVE SARS-CoV-2 nucleic acids MAY BE PRESENT.   A presumptive positive result was obtained on the submitted specimen  and confirmed on repeat testing.  While 2019 novel coronavirus  (SARS-CoV-2) nucleic acids may be present in the submitted sample  additional confirmatory testing may be necessary for epidemiological  and / or clinical management purposes  to differentiate between  SARS-CoV-2 and other Sarbecovirus currently known to infect humans.  If clinically indicated additional testing with an alternate test  methodology [phone]([id number]) is advised. The SARS-CoV-2 RNA is generally  detectable in upper and lower  respiratory sp ecimens during the acute  phase of infection. The expected result is Negative. Fact Sheet for Patients:  StrictlyIdeas.no Fact Sheet for Healthcare Providers: BankingDealers.co.za This test is not yet approved or cleared by the [place] FDA and has been authorized for detection and/or diagnosis of SARS-CoV-2 by  FDA under an Emergency Use Authorization (EUA).  This EUA will remain in effect (meaning this test can be used) for the duration of the COVID-19 declaration under Section 564(b)(1) of the Act, 21 U.S.C. section 360bbb-3(b)(1), unless the authorization is terminated or revoked sooner. Performed at [facility], [address] [place], [place] [address]    Studies/Results: Nm Gi Blood Loss  Result Date: 10/21/2018 CLINICAL DATA:  GI bleed. EXAM: NUCLEAR MEDICINE GASTROINTESTINAL BLEEDING SCAN TECHNIQUE: Sequential abdominal images were obtained following intravenous administration of Tc-57m labeled red blood cells. RADIOPHARMACEUTICALS:  22.813 mCi Tc-8m pertechnetate in-vitro labeled red cells. COMPARISON:  None. FINDINGS: No active source of GI bleed identified. There is activity in the lower pelvis from the beginning of the study which persists throughout the study, not consistent with an active GI bleed. IMPRESSION: No active GI bleed identified. Electronically Signed   By: Dorise Bullion III M.D   On: 10/21/2018 20:59   Medications: I have reviewed the patient's current medications. Scheduled Meds: . lactulose  30 g Oral BID  . pantoprazole  40 mg Oral BID AC  . potassium chloride  40 mEq Oral TID  . thiamine  100 mg Oral Daily  . traZODone  100 mg Oral QHS   Continuous Infusions: PRN Meds:.acetaminophen **OR** acetaminophen, ondansetron **OR** ondansetron (ZOFRAN) IV   Assessment: Active Problems:   Bright red blood per rectum   Alcoholic cirrhosis (HCC)   Goals of care, counseling/discussion   Palliative care by specialist   Alcoholic hepatitis with ascites    Plan: This patient has alcoholic hepatitis without improvement in her liver functions over the last few days.  The patient has been told about the dire nature of her alcoholic hepatitis.  The patient's rectal bleeding has been investigated with multiple luminal procedures and I do not recommend repeating  these.  An anal fissure or number of bleeding are the most likely cause of this patient's symptoms.  Her hemoglobin is only slightly decreased to 10.3 today.  Nothing further to add from a GI point of view except that if she has any sign of any further bleeding a repeat bleeding scan to ascertain a location other than what has been investigated would be recommended.   LOS: 3 days   Cashlyn Huguley 10/22/2018, 11:17 AM

## 2018-10-23 LAB — COMPREHENSIVE METABOLIC PANEL
ALT: 71 U/L — ABNORMAL HIGH (ref 0–44)
AST: 196 U/L — ABNORMAL HIGH (ref 15–41)
Albumin: 1.9 g/dL — ABNORMAL LOW (ref 3.5–5.0)
Alkaline Phosphatase: 167 U/L — ABNORMAL HIGH (ref 38–126)
Anion gap: 5 (ref 5–15)
BUN: 7 mg/dL (ref 6–20)
CO2: 12 mmol/L — ABNORMAL LOW (ref 22–32)
Calcium: 8.5 mg/dL — ABNORMAL LOW (ref 8.9–10.3)
Chloride: 128 mmol/L — ABNORMAL HIGH (ref 98–111)
Creatinine, Ser: UNDETERMINED mg/dL (ref 0.44–1.00)
Glucose, Bld: 121 mg/dL — ABNORMAL HIGH (ref 70–99)
Potassium: 3.2 mmol/L — ABNORMAL LOW (ref 3.5–5.1)
Sodium: 145 mmol/L (ref 135–145)
Total Bilirubin: 32.1 mg/dL (ref 0.3–1.2)
Total Protein: 4.8 g/dL — ABNORMAL LOW (ref 6.5–8.1)

## 2018-10-23 LAB — MAGNESIUM: Magnesium: 2 mg/dL (ref 1.7–2.4)

## 2018-10-23 MED ORDER — POTASSIUM CHLORIDE 20 MEQ PO PACK
40.0000 meq | PACK | Freq: Three times a day (TID) | ORAL | Status: AC
  Administered 2018-10-23 (×3): 40 meq via ORAL
  Filled 2018-10-23 (×3): qty 2

## 2018-10-23 NOTE — Progress Notes (Signed)
The patient has had no further sign of bleeding.  The patient should be continued on supportive care for her jaundice and acute alcoholic hepatitis.  This may take some time to resolve.  As she did in my last note if the patient should have any rebleeding then a bleeding scan could be done to confirm the location of the bleeding.  The patient should avoid any further alcohol use.  I will sign off.  Please call if any further GI concerns or questions.  We would like to thank you for the opportunity to participate in the care of Shelley [name].

## 2018-10-23 NOTE — Progress Notes (Signed)
PHARMACY CONSULT NOTE - FOLLOW UP  Pharmacy Consult for Electrolyte Monitoring and Replacement   Recent Labs: Potassium (mmol/L)  Date Value  10/23/2018 3.2 (L)   Magnesium (mg/dL)  Date Value  10/23/2018 2.0   Calcium (mg/dL)  Date Value  10/23/2018 8.5 (L)   Albumin (g/dL)  Date Value  10/23/2018 1.9 (L)   Sodium (mmol/L)  Date Value  10/23/2018 145  01/17/2014 142     Assessment: 53 year old female with h/o alcoholic hepatitis per recent hospitalization at [facility]. Patient presented with rectal bleeding. Potassium 2.1 on admission then < 2 with morning labs on 7/2. She received a total of 60 mEq oral KCl on 7/3 and 120 mEq on 7/4 (increased from 2.8 to 3.2)  Goal of Therapy:  Electrolytes WNL  Plan:   Will repeat 40 mEq PO x 3 today   Will order potassium level with morning labs.  Pharmacy will continue to follow and replace electrolytes as indicated.  Dallie Piles ,PharmD Clinical Pharmacist 10/23/2018 10:50 AM

## 2018-10-23 NOTE — Progress Notes (Signed)
[place] [facility] [place] NAME: Shelley [name]    MR#:  [id number]  DATE OF BIRTH:  July 06, 1965  SUBJECTIVE:  CHIEF COMPLAINT:   Chief Complaint  Patient presents with  . Rectal Bleeding  . Jaundice   Patient has no bloody stool.  She has no complaints except generalized weakness. REVIEW OF SYSTEMS:  Review of Systems  Constitutional: Positive for malaise/fatigue. Negative for chills and fever.  HENT: Negative for sore throat.   Eyes: Negative for blurred vision and double vision.  Respiratory: Negative for cough, hemoptysis, shortness of breath, wheezing and stridor.   Cardiovascular: Negative for chest pain, palpitations, orthopnea and leg swelling.  Gastrointestinal: Negative for abdominal pain, blood in stool, diarrhea, melena, nausea and vomiting.  Genitourinary: Negative for dysuria, flank pain and hematuria.  Musculoskeletal: Negative for back pain and joint pain.  Neurological: Negative for dizziness, sensory change, focal weakness, seizures, loss of consciousness, weakness and headaches.  Endo/Heme/Allergies: Negative for polydipsia.  Psychiatric/Behavioral: Negative for depression. The patient is not nervous/anxious.     DRUG ALLERGIES:  No Known Allergies VITALS:  Blood pressure 96/60, pulse 88, temperature 98.6 F (37 C), temperature source Oral, resp. rate 16, height 5\' 3"  (1.6 m), weight 70.3 kg, SpO2 100 %. PHYSICAL EXAMINATION:  Physical Exam Constitutional:      General: She is not in acute distress. HENT:     Head: Normocephalic.     Mouth/Throat:     Mouth: Mucous membranes are moist.  Eyes:     General: No scleral icterus.    Conjunctiva/sclera: Conjunctivae normal.     Pupils: Pupils are equal, round, and reactive to light.  Neck:     Musculoskeletal: Normal range of motion and neck supple.     Vascular: No JVD.     Trachea: No tracheal deviation.  Cardiovascular:     Rate and Rhythm: Normal rate and  regular rhythm.     Heart sounds: Normal heart sounds. No murmur. No gallop.   Pulmonary:     Effort: Pulmonary effort is normal. No respiratory distress.     Breath sounds: Normal breath sounds. No wheezing or rales.  Abdominal:     General: Bowel sounds are normal. There is no distension.     Palpations: Abdomen is soft.     Tenderness: There is no abdominal tenderness. There is no rebound.  Musculoskeletal: Normal range of motion.        General: No tenderness.     Right lower leg: Edema present.     Left lower leg: Edema present.  Skin:    Coloration: Skin is jaundiced.     Findings: No erythema or rash.  Neurological:     General: No focal deficit present.     Mental Status: She is alert and oriented to person, place, and time.     Cranial Nerves: No cranial nerve deficit.  Psychiatric:     Comments: Looks drowsy.    LABORATORY PANEL:  Female CBC Recent Labs  Lab 10/20/18 0146  10/21/18 2137 10/22/18 0615  WBC 5.0  --   --   --   HGB 9.3*   < > 10.7* 10.3*  HCT 26.8*   < > 31.4*  --   PLT 211  --   --   --    < > = values in this interval not displayed.   ------------------------------------------------------------------------------------------------------------------ Chemistries  Recent Labs  Lab 10/23/18 0554  NA 145  K 3.2*  CL 128*  CO2 12*  GLUCOSE 121*  BUN 7  CREATININE UNABLE TO REPORT DUE TO ICTERUS  CALCIUM 8.5*  MG 2.0  AST 196*  ALT 71*  ALKPHOS 167*  BILITOT 32.1*   RADIOLOGY:  No results found. ASSESSMENT AND PLAN:   53 year old female with past medical history of alcohol abuse, PTSD, anxiety, bipolar disorder-recently diagnosed alcoholic liver cirrhosis who presents to the hospital due to rectal bleeding and suspected GI bleed and also noted to be hypokalemic.  1.  GI bleed-patient presents to the hospital with some rectal bleeding.  Patient was recently hospitalized at [facility] and underwent an upper GI endoscopy and colonoscopy.   Patient's colonoscopy showed some hemorrhoids and upper GI endoscopy showed some gastritis/portal gastropathy but no esophageal varices. - Hemoglobin currently stable, will follow serial hemoglobins.  Placed on IV Protonix,. Advanced to soft diet.  Changed to p.o. Protonix. The patient had one episode of rectal bleeding yesterday.  No surgical indication per Dr. Tonna BoehringerSakai.  Bleeding scan is normal.  Hemoglobin is stable.  No active bleeding.  2.  Hypokalemia-  potassium supplement orally and intravenously. Calcium still low at 3.2.  Continue potassium supplement.  3.  Hypotension-secondary to underlying chronic liver disease combined with the GI bleed. -Patient is transfused blood and given IV fluids.  Follow hemodynamics.  Patient is clinically asymptomatic.  4.  Alcoholic liver cirrhosis- patient is severely jaundiced her total bilirubin is over 30. - Patient's prognosis is quite poor.    Follow-up palliative care consult discuss goals of care. - No evidence of hepatic encephalopathy.  Continue lactulose.  She needs outpatient palliative care follow-up.  No improvement of liver function test.  5.  Depression-continue trazodone.  All the records are reviewed and case discussed with Care Management/Social Worker. Management plans discussed with the patient, her spouse and they are in agreement.  CODE STATUS: DNR  TOTAL TIME TAKING CARE OF THIS PATIENT: 27 minutes.   More than 50% of the time was spent in counseling/coordination of care: YES  POSSIBLE D/C IN 2 DAYS, DEPENDING ON CLINICAL CONDITION.   Shaune PollackQing Mayeli Bornhorst M.D on 10/23/2018 at 10:00 AM  Between 7am to 6pm - Pager - [phone]  After 6pm go to [email or web address] - Therapist, nutritionalpassword EPAS [facility]  [employer] [facility]

## 2018-10-24 ENCOUNTER — Inpatient Hospital Stay: Payer: Medicare Other

## 2018-10-24 LAB — HEMOGLOBIN: Hemoglobin: 10.2 g/dL — ABNORMAL LOW (ref 12.0–15.0)

## 2018-10-24 LAB — POTASSIUM: Potassium: 3.6 mmol/L (ref 3.5–5.1)

## 2018-10-24 MED ORDER — POTASSIUM CHLORIDE CRYS ER 20 MEQ PO TBCR
40.0000 meq | EXTENDED_RELEASE_TABLET | Freq: Once | ORAL | Status: AC
  Administered 2018-10-24: 40 meq via ORAL
  Filled 2018-10-24: qty 2

## 2018-10-24 MED ORDER — RIFAXIMIN 550 MG PO TABS
550.0000 mg | ORAL_TABLET | Freq: Two times a day (BID) | ORAL | Status: DC
  Filled 2018-10-24: qty 1

## 2018-10-24 MED ORDER — GERHARDT'S BUTT CREAM
TOPICAL_CREAM | Freq: Four times a day (QID) | CUTANEOUS | Status: DC
  Administered 2018-10-24 – 2018-10-26 (×8): via TOPICAL
  Filled 2018-10-24: qty 1

## 2018-10-24 MED ORDER — PREDNISOLONE SODIUM PHOSPHATE 15 MG/5ML PO SOLN
40.0000 mg | Freq: Every day | ORAL | Status: DC
  Administered 2018-10-25: 11:00:00 40 mg via ORAL
  Filled 2018-10-24 (×2): qty 15
  Filled 2018-10-24: qty 13.33

## 2018-10-24 MED ORDER — PREDNISOLONE 5 MG PO TABS
40.0000 mg | ORAL_TABLET | Freq: Every day | ORAL | Status: DC
  Filled 2018-10-24: qty 8

## 2018-10-24 MED ORDER — LACTULOSE 10 GM/15ML PO SOLN
30.0000 g | Freq: Three times a day (TID) | ORAL | Status: DC
  Administered 2018-10-24 – 2018-10-25 (×2): 30 g via ORAL
  Filled 2018-10-24 (×3): qty 60

## 2018-10-24 NOTE — Consult Note (Signed)
[place] Nurse wound consult note Reason for Consult:severe MASD in the perineal areas and medial thighs, also bilateral inguinal areas from UI and FI (dual) in patient who is declining Wound type:moisture Pressure Injury POA: NA Measurement:N/A Wound bed:red, moist Drainage (amount, consistency, odor) scant serous Periwound:erythematous, edematous, with satellite lesions at medial thighs Dressing procedure/placement/frequency: I will implement a mattress replacement for microclimate mitigation, provide a topical with antifungal and skin barrier plus hydrocortisone (Gerhart's Butt cream). Nursing staff is turning and repositioning.  [place] nursing team will not follow, but will remain available to this patient, the nursing and medical teams.  Please re-consult if needed. Thanks, Maudie Flakes, MSN, RN, [place], Arther Abbott  Pager# [phone]

## 2018-10-24 NOTE — Progress Notes (Signed)
PHARMACY CONSULT NOTE - FOLLOW UP  Pharmacy Consult for Electrolyte Monitoring and Replacement   Recent Labs: Potassium (mmol/L)  Date Value  10/24/2018 3.6   Magnesium (mg/dL)  Date Value  10/23/2018 2.0   Calcium (mg/dL)  Date Value  10/23/2018 8.5 (L)   Albumin (g/dL)  Date Value  10/23/2018 1.9 (L)   Sodium (mmol/L)  Date Value  10/23/2018 145  01/17/2014 142     Assessment: 53 year old female with h/o alcoholic hepatitis per recent hospitalization at [facility]. Patient presented with rectal bleeding. Potassium 2.1 on admission then < 2 with morning labs on 7/2. She received a total of 60 mEq oral KCl on 7/3 and 120 mEq on 7/4 (increased from 2.8 to 3.2)  Goal of Therapy:  Electrolytes WNL  Plan:   Current electrolytes WNL. Will defer further supplementation at this time.   Will continue to monitor levels with morning labs.  Pharmacy will continue to follow and replace electrolytes as indicated.  Pearla Dubonnet ,PharmD Clinical Pharmacist 10/24/2018 7:21 AM

## 2018-10-24 NOTE — Progress Notes (Signed)
Patient is lethargic this morning and is very jaundice. Took approximately 30 min to take PO meds with immense encouragement. Patient gagging with any food. Concerned that patient is declining. Flat effect. MD made aware. Madlyn Frankel, RN

## 2018-10-24 NOTE — Progress Notes (Signed)
Patient ID: Shelley [name], female   DOB: 30-May-1965, 53 y.o.   MRN: [id number]  [employer] PROGRESS NOTE  EDIE VALLANDINGHAM ATF:[id number] DOB: 1965-11-07 DOA: 10/19/2018 PCP: [facility]  HPI/Subjective: Patient seen this morning.  She was lethargic.  She did answer few questions.  She states she does not feel well but cannot elaborate.  Not feeling good this morning.  Objective: Vitals:   10/24/18 0945 10/24/18 1420  BP: (!) 109/58 101/79  Pulse: 85 96  Resp:  20  Temp:  98.4 F (36.9 C)  SpO2: 100% 100%    Intake/Output Summary (Last 24 hours) at 10/24/2018 1448 Last data filed at 10/24/2018 0933 Gross per 24 hour  Intake 1598.46 ml  Output -  Net 1598.46 ml   Filed Weights   10/19/18 1804 10/20/18 0013  Weight: 66.2 kg 70.3 kg    ROS: Review of Systems  Unable to perform ROS: Acuity of condition  Respiratory: Negative for shortness of breath.   Cardiovascular: Negative for chest pain.  Gastrointestinal: Positive for abdominal pain.   Exam: Physical Exam  HENT:  Nose: No mucosal edema.  Mouth/Throat: No oropharyngeal exudate or posterior oropharyngeal edema.  Eyes: Pupils are equal, round, and reactive to light. Conjunctivae are normal. Scleral icterus is present.  Neck: No JVD present. Carotid bruit is not present. No edema present. No thyroid mass and no thyromegaly present.  Cardiovascular: S1 normal and S2 normal. Exam reveals no gallop.  No murmur heard. Pulses:      Dorsalis pedis pulses are 2+ on the right side and 2+ on the left side.  Respiratory: No respiratory distress. She has no wheezes. She has no rhonchi. She has no rales.  GI: Soft. Bowel sounds are normal. There is no abdominal tenderness.  Musculoskeletal:     Right ankle: She exhibits no swelling.     Left ankle: She exhibits no swelling.  Lymphadenopathy:    She has no cervical adenopathy.  Neurological: She is alert. No cranial nerve deficit.  Skin: Skin is  warm. Nails show no clubbing.  Skin jaundiced  Psychiatric: Her affect is blunt.      Data Reviewed: Basic Metabolic Panel: Recent Labs  Lab 10/19/18 1832 10/20/18 0146 10/20/18 0636 10/20/18 1357 10/21/18 0536 10/22/18 0615 10/23/18 0554 10/24/18 0449  NA 138 138  --   --   --  143 145  --   K 2.1* <2.0*  --  2.6* 3.0* 2.8* 3.2* 3.6  CL 108 110  --   --   --  121* 128*  --   CO2 18* 17*  --   --   --  13* 12*  --   GLUCOSE 101* 61*  --   --   --  114* 121*  --   BUN 9 10  --   --   --  6 7  --   CREATININE UNABLE TO REPORT DUE TO ICTERUS MJU UNABLE TO REPORT DUE TO ICTERUS   --   --   --  UNABLE TO REPORT DUE TO ICTERUS UNABLE TO REPORT DUE TO ICTERUS  --   CALCIUM 8.7* 8.2*  --   --   --  8.3* 8.5*  --   MG 2.0 2.0 2.0  --   --   --  2.0  --    Liver Function Tests: Recent Labs  Lab 10/19/18 1832 10/20/18 0146 10/22/18 0615 10/23/18 0554  AST 158* 145* 167* 196*  ALT  55* 46* 61* 71*  ALKPHOS 192* 165* 164* 167*  BILITOT 37.8* 33.4* 33.1* 32.1*  PROT 5.8* 5.2* 5.0* 4.8*  ALBUMIN 2.5* 2.2* 2.0* 1.9*    Recent Labs  Lab 10/20/18 0146  AMMONIA 95*   CBC: Recent Labs  Lab 10/19/18 1832 10/20/18 0146 10/20/18 0636 10/21/18 0536 10/21/18 1754 10/21/18 2137 10/22/18 0615 10/24/18 0450  WBC 5.1 5.0  --   --   --   --   --   --   NEUTROABS 3.4  --   --   --   --   --   --   --   HGB 9.0* 9.3* 9.8* 9.7* 11.0* 10.7* 10.3* 10.2*  HCT 26.5* 26.8* 28.6*  --   --  31.4*  --   --   MCV 99.3 95.0  --   --   --   --   --   --   PLT 252 211  --   --   --   --   --   --      Recent Results (from the past 240 hour(s))  SARS Coronavirus 2 (CEPHEID- Performed in [facility] hospital lab), Hosp Order     Status: None   Collection Time: 10/19/18  6:39 PM   Specimen: Nasopharyngeal Swab  Result Value Ref Range Status   SARS Coronavirus 2 NEGATIVE NEGATIVE Final    Comment: (NOTE) If result is NEGATIVE SARS-CoV-2 target nucleic acids are NOT DETECTED. The  SARS-CoV-2 RNA is generally detectable in upper and lower  respiratory specimens during the acute phase of infection. The lowest  concentration of SARS-CoV-2 viral copies this assay can detect is 250  copies / mL. A negative result does not preclude SARS-CoV-2 infection  and should not be used as the sole basis for treatment or other  patient management decisions.  A negative result may occur with  improper specimen collection / handling, submission of specimen other  than nasopharyngeal swab, presence of viral mutation(s) within the  areas targeted by this assay, and inadequate number of viral copies  (<250 copies / mL). A negative result must be combined with clinical  observations, patient history, and epidemiological information. If result is POSITIVE SARS-CoV-2 target nucleic acids are DETECTED. The SARS-CoV-2 RNA is generally detectable in upper and lower  respiratory specimens dur ing the acute phase of infection.  Positive  results are indicative of active infection with SARS-CoV-2.  Clinical  correlation with patient history and other diagnostic information is  necessary to determine patient infection status.  Positive results do  not rule out bacterial infection or co-infection with other viruses. If result is PRESUMPTIVE POSTIVE SARS-CoV-2 nucleic acids MAY BE PRESENT.   A presumptive positive result was obtained on the submitted specimen  and confirmed on repeat testing.  While 2019 novel coronavirus  (SARS-CoV-2) nucleic acids may be present in the submitted sample  additional confirmatory testing may be necessary for epidemiological  and / or clinical management purposes  to differentiate between  SARS-CoV-2 and other Sarbecovirus currently known to infect humans.  If clinically indicated additional testing with an alternate test  methodology [phone]([id number]) is advised. The SARS-CoV-2 RNA is generally  detectable in upper and lower respiratory sp ecimens during the acute   phase of infection. The expected result is Negative. Fact Sheet for Patients:  BoilerBrush.com.cyhttps://www.fda.gov/media/136312/download Fact Sheet for Healthcare Providers: https://pope.com/https://www.fda.gov/media/136313/download This test is not yet approved or cleared by the [place] FDA and has been authorized for detection  and/or diagnosis of SARS-CoV-2 by FDA under an Emergency Use Authorization (EUA).  This EUA will remain in effect (meaning this test can be used) for the duration of the COVID-19 declaration under Section 564(b)(1) of the Act, 21 U.S.C. section 360bbb-3(b)(1), unless the authorization is terminated or revoked sooner. Performed at [facility], [address] [place], [place] [address]      Studies: Koreas Abdomen Limited  Result Date: 10/24/2018 INDICATION: Ascites. EXAM: ULTRASOUND GUIDED  PARACENTESIS MEDICATIONS: None. COMPLICATIONS: None immediate. PROCEDURE: Abdominal ultrasound obtained to evaluate for ascites/paracentesis. Only a small amount of fluid noted. No paracentesis performed. FINDINGS: No paracentesis performed due to small amount of fluid. Follow-up exam can be obtained as needed. IMPRESSION: No paracentesis performed due to small amount of fluid. Electronically Signed   By: Maisie Fushomas  Register   On: 10/24/2018 11:12    Scheduled Meds: . Gerhardt's butt cream   Topical QID  . lactulose  30 g Oral BID  . pantoprazole  40 mg Oral BID AC  . thiamine  100 mg Oral Daily  . traZODone  100 mg Oral QHS   Continuous Infusions: . dextrose 5 % and 0.9% NaCl 50 mL/hr at 10/24/18 1004    Assessment/Plan:  1. Acute encephalopathy.  Likely due to hepatic encephalopathy.  Increased dose of lactulose to 3 times daily dosing.  Start Xifaxan.  Check an ammonia level tomorrow morning.  Not enough fluid in the abdomen to draw off. 2. Alcoholic liver cirrhosis, alcoholic hepatitis, jaundice.  Start prednisolone. 3. Gastrointestinal bleed.  Hemoglobin stable.  Bleeding scan  negative.  Gastroenterology team signing off. 4. Hypotension secondary to underlying chronic liver disease 5. Depression on trazodone 6. Hypokalemia replace potassium 7. Reconsult palliative care because the patient is declining.  Code Status:     Code Status Orders  (From admission, onward)         Start     Ordered   10/20/18 0012  Do not attempt resuscitation (DNR)  Continuous    Question Answer Comment  In the event of cardiac or respiratory ARREST Do not call a "code blue"   In the event of cardiac or respiratory ARREST Do not perform Intubation, CPR, defibrillation or ACLS   In the event of cardiac or respiratory ARREST Use medication by any route, position, wound care, and other measures to relive pain and suffering. May use oxygen, suction and manual treatment of airway obstruction as needed for comfort.      10/20/18 0012        Code Status History    This patient has a current code status but no historical code status.   Advance Care Planning Activity     Family Communication: Tried to reach the patient's husband but the mailbox is full Disposition Plan: To be determined  Time spent: 28 minutes  Cleotilde Spadaccini [employer]  [employer]

## 2018-10-24 NOTE — Progress Notes (Signed)
Palliative: [name] Shelley [name], Shelley [name], is lying quietly in bed.  She appears acutely/chronically ill and frail.  There is a market decline since I last saw her on Friday.  She remains very jaundiced, but is now unable to fully communicate.  She is able to take sips of water, but declines any food.  There is no family at bedside at this time due to visitor restrictions.  Shelley Shelley [name] Shelley [name] agrees for me to call her significant other, Shelley Shelley [name] Shelley [name].  Conference with nursing staff related to patient condition, needs.  Call to significant other, Shelley Shelley [name] Shelley [name] at [phone].  Shelley Shelley [name] Shelley [name] and I talked about Shelley Shelley [name] Shelley [name]'s acute illness.  He tells me that he spoke with the doctor this morning and understands that if she were to get better she could come home tomorrow.  I share that unfortunately Shelley [name] has worsened.  Shelley Shelley [name] Shelley [name] tells me that he believes she is much worse.  He is very tearful, asking to come to the hospital.  I think it is important for him to see how weak and frail she truly is at this point.  Nursing staff updated.  I share that if Shelley [name] does not improve by tomorrow, she would qualify for/benefit from hospice care.  I do not press Shelley Shelley [name] Shelley [name] to make a choice at this time.  During previous's conversations Shelley Shelley [name] endorse Shelley Shelley [name] Shelley [name] as her Ambulance person over her children.  Plan: 24 to 48 hours for outcomes.  Qualifies for residential hospice care, working with significant other for direction of care.  27 minutes Shelley Shelley [name] Axe, NP Palliative Medicine Team Team Phone # [phone] Greater than 50% of this time was spent counseling and coordinating care related to the above assessment and plan.

## 2018-10-24 NOTE — Care Management Important Message (Signed)
Important Message  Patient Details  Name: Shelley [name] MRN: [id number] Date of Birth: 04/09/1966   Medicare Important Message Given:  Yes     [name] 10/24/2018, 12:32 PM

## 2018-10-24 NOTE — Progress Notes (Signed)
Patient ID: Shelley [name], female   DOB: December 17, 1965, 53 y.o.   MRN: [id number]  Spoke with the patient at the bedside and the patient's husband on the phone.  Diagnosis: Acute encephalopathy which is likely hepatic encephalopathy, alcoholic liver disease, alcoholic hepatitis, jaundice, gastrointestinal bleed, hypotension, depression, hypokalemia.  Patient was lethargic this morning, she could not elaborate.  She states she did not feel well.  I spoke with the patient's husband and I am very concerned about her mental status and her overall condition and her overall prognosis.  The patient is already a DO NOT RESUSCITATE.  I increased her lactulose dose and I started Xifaxan to try to get her hepatic encephalopathy better.  Everything will keep off the mental status.  If her mental status does not improve, she will not survive very long.  I reconsulted palliative care.  I started prednisolone.  Time spent on ACP discussion 17 minutes Dr Loletha Grayer

## 2018-10-25 ENCOUNTER — Inpatient Hospital Stay: Payer: Medicare Other

## 2018-10-25 DIAGNOSIS — Z7189 Other specified counseling: Secondary | ICD-10-CM

## 2018-10-25 LAB — COMPREHENSIVE METABOLIC PANEL
ALT: 108 U/L — ABNORMAL HIGH (ref 0–44)
AST: 252 U/L — ABNORMAL HIGH (ref 15–41)
Albumin: 2.1 g/dL — ABNORMAL LOW (ref 3.5–5.0)
Alkaline Phosphatase: 206 U/L — ABNORMAL HIGH (ref 38–126)
BUN: 12 mg/dL (ref 6–20)
CO2: 12 mmol/L — ABNORMAL LOW (ref 22–32)
Calcium: 9 mg/dL (ref 8.9–10.3)
Chloride: >130 mmol/L (ref 98–111)
Creatinine, Ser: UNDETERMINED mg/dL (ref 0.44–1.00)
Glucose, Bld: 102 mg/dL — ABNORMAL HIGH (ref 70–99)
Potassium: 3.2 mmol/L — ABNORMAL LOW (ref 3.5–5.1)
Sodium: 150 mmol/L — ABNORMAL HIGH (ref 135–145)
Total Bilirubin: 36.5 mg/dL (ref 0.3–1.2)
Total Protein: 5.3 g/dL — ABNORMAL LOW (ref 6.5–8.1)

## 2018-10-25 LAB — PHOSPHORUS: Phosphorus: UNDETERMINED mg/dL (ref 2.5–4.6)

## 2018-10-25 LAB — MAGNESIUM: Magnesium: 2.1 mg/dL (ref 1.7–2.4)

## 2018-10-25 LAB — AMMONIA: Ammonia: 124 umol/L — ABNORMAL HIGH (ref 9–35)

## 2018-10-25 LAB — HEMOGLOBIN: Hemoglobin: 11 g/dL — ABNORMAL LOW (ref 12.0–15.0)

## 2018-10-25 MED ORDER — PIPERACILLIN-TAZOBACTAM 3.375 G IVPB: 3.3750 g | Freq: Three times a day (TID) | INTRAVENOUS | Status: DC

## 2018-10-25 MED ORDER — POTASSIUM CHLORIDE CRYS ER 20 MEQ PO TBCR: 40.0000 meq | EXTENDED_RELEASE_TABLET | Freq: Once | ORAL | Status: DC

## 2018-10-25 MED ORDER — LORAZEPAM 2 MG/ML IJ SOLN
0.5000 mg | INTRAMUSCULAR | Status: DC | PRN
  Administered 2018-10-25 – 2018-10-26 (×2): 1 mg via INTRAVENOUS
  Filled 2018-10-25 (×2): qty 1

## 2018-10-25 MED ORDER — POTASSIUM CL IN DEXTROSE 5% 20 MEQ/L IV SOLN
20.0000 meq | INTRAVENOUS | Status: DC
  Administered 2018-10-25: 10:00:00 20 meq via INTRAVENOUS
  Filled 2018-10-25 (×2): qty 1000

## 2018-10-25 NOTE — Progress Notes (Signed)
Received referral for inpatient hospice on this patient from [place].  We have no bed offer, but are hopeful that we will be able to offer a bed in the morning.  This patient is known to our agency and is currently followed by our in home/facility palliative medicine team. New information faxed to referral intake.  Dimas Aguas, RN Clinical Nurse Liaison [facility] [phone] Cell

## 2018-10-25 NOTE — Progress Notes (Signed)
Palliative:   [name], [name], is lying quietly in bed.  She seems to have worsened, with labored breathing now.  She remains very jaundiced and difficult to arouse.  Significant other/responsible party, Shelley [name] is at bedside. We talked about Shelley [name]'s acute health concerns and her decline over the last few days.  Shelley [name] is tearful, but states he realizes that she is dying.  After discussion he elects to focus on comfort and dignity, residential hospice with [facility] location.  We talked about the treatment plan including unburdening Shelley [name] from treatments that are changing what is happening, instead focusing on comfort only.  Shelley [name] states understanding, support given.  Conference with attending, bedside nurse, RN case management.   Plan: Comfort and dignity at end of life, residential hospice at [place].  Symptom management per hospice protocol.   55 minutes, extended time.  Shelley Axe, NP Palliative Medicine Team Team Phone # [phone] Greater than 50% of this time was spent counseling and coordinating care related to the above assessment and plan.

## 2018-10-25 NOTE — Progress Notes (Signed)
PHARMACY CONSULT NOTE - FOLLOW UP  Pharmacy Consult for Electrolyte Monitoring and Replacement   Recent Labs: Potassium (mmol/L)  Date Value  10/25/2018 3.2 (L)   Magnesium (mg/dL)  Date Value  10/25/2018 2.1   Calcium (mg/dL)  Date Value  10/25/2018 9.0   Albumin (g/dL)  Date Value  10/25/2018 2.1 (L)   Phosphorus (mg/dL)  Date Value  10/25/2018  UNABLE TO RESULT DUE TO ICTERUS SDR   Sodium (mmol/L)  Date Value  10/25/2018 150 (H)  01/17/2014 142     Assessment: 53 year old female with h/o alcoholic hepatitis per recent hospitalization at [facility]. Patient presented with rectal bleeding. Potassium 2.1 on admission then < 2 with morning labs on 7/2. She received a total of 60 mEq oral KCl on 7/3 and 120 mEq on 7/4 (increased from 2.8 to 3.2)  Goal of Therapy:  Electrolytes WNL  Plan:   Physician has ordered 41mEq in NS IV continuous infusion. Will order KCl 3mEq PO x 1 in addition.   Will continue to monitor levels with morning labs.  Pharmacy will continue to follow and replace electrolytes as indicated.  Pearla Dubonnet ,PharmD Clinical Pharmacist 10/25/2018 7:24 AM

## 2018-10-25 NOTE — Progress Notes (Addendum)
I called and spoke with Mrs. Windmiller's husband, Karrie Doffing.  I wanted to be proactive in getting Mrs. Acord to the hospice home tomorrow morning.  He will plan on meeting with our social worker, Clenton Pare at 0930 to complete consents.  Still awaiting approval from Dr. Gilford Rile, however, patient appears appropriate for inpatient hospice for end of life care and symptom management of shortntess of breath, pain and terminal restlessness.  I will confirm bed offer in the morning with Doran Clay, once this RN hears back from our hospice physician- Dr. Gilford Rile. If approved I would like a 0945 pick up from EMS to hospice home which I will arrange after speaking to [place].   Dimas Aguas, RN [facility] [phone] cell

## 2018-10-25 NOTE — Progress Notes (Signed)
Patient ID: Shelley [name], female   DOB: June 02, 1965, 53 y.o.   MRN: [id number]  [employer] PROGRESS NOTE  Shelley [name] WUJ:[id number] DOB: June 02, 1965 DOA: 10/19/2018 [facility]  HPI/Subjective: Patient seen this morning.  Says she feels okay and answers a few questions.  Some abdominal pain.  Patient using some accessory muscles to breathe.  Objective: Vitals:   10/24/18 2038 10/25/18 0431  BP: (!) 91/53 137/90  Pulse: 98 96  Resp: (!) 22 (!) 22  Temp: 98.5 F (36.9 C) 98.4 F (36.9 C)  SpO2: 97% 99%    Intake/Output Summary (Last 24 hours) at 10/25/2018 [id number] Last data filed at 10/25/2018 0200 Gross per 24 hour  Intake 1371.1 ml  Output -  Net 1371.1 ml   Filed Weights   10/19/18 1804 10/20/18 0013  Weight: 66.2 kg 70.3 kg    ROS: Review of Systems  Unable to perform ROS: Acuity of condition  Respiratory: Negative for shortness of breath.   Cardiovascular: Negative for chest pain.  Gastrointestinal: Positive for abdominal pain.   Exam: Physical Exam  HENT:  Nose: No mucosal edema.  Mouth/Throat: No oropharyngeal exudate or posterior oropharyngeal edema.  Eyes: Pupils are equal, round, and reactive to light. Conjunctivae are normal. Scleral icterus is present.  Neck: No JVD present. Carotid bruit is not present. No edema present. No thyroid mass and no thyromegaly present.  Cardiovascular: S1 normal and S2 normal. Exam reveals no gallop.  No murmur heard. Pulses:      Dorsalis pedis pulses are 2+ on the right side and 2+ on the left side.  Respiratory: Tachypnea noted. No respiratory distress. She has decreased breath sounds in the right lower field and the left lower field. She has no wheezes. She has no rhonchi. She has no rales.  GI: Soft. Bowel sounds are normal. There is abdominal tenderness.  Musculoskeletal:     Right ankle: She exhibits no swelling.     Left ankle: She exhibits no swelling.  Lymphadenopathy:    She has  no cervical adenopathy.  Neurological: She is alert. No cranial nerve deficit.  Skin: Skin is warm. Nails show no clubbing.  Skin jaundiced  Psychiatric: Her affect is blunt.      Data Reviewed: Basic Metabolic Panel: Recent Labs  Lab 10/19/18 1832 10/20/18 0146 10/20/18 [id number]  10/21/18 [id number] 10/22/18 0615 10/23/18 0554 10/24/18 0449 10/25/18 0421  NA 138 138  --   --   --  143 145  --  150*  K 2.1* <2.0*  --    < > 3.0* 2.8* 3.2* 3.6 3.2*  CL 108 110  --   --   --  121* 128*  --  >130*  CO2 18* 17*  --   --   --  13* 12*  --  12*  GLUCOSE 101* 61*  --   --   --  114* 121*  --  102*  BUN 9 10  --   --   --  6 7  --  12  CREATININE UNABLE TO REPORT DUE TO ICTERUS MJU UNABLE TO REPORT DUE TO ICTERUS   --   --   --  UNABLE TO REPORT DUE TO ICTERUS UNABLE TO REPORT DUE TO ICTERUS  --  UNABLE TO RESULT DUE TO ICTERUS  CALCIUM 8.7* 8.2*  --   --   --  8.3* 8.5*  --  9.0  MG 2.0 2.0 2.0  --   --   --  2.0  --  2.1  PHOS  --   --   --   --   --   --   --   --   UNABLE TO RESULT DUE TO ICTERUS SDR   < > = values in this interval not displayed.   Liver Function Tests: Recent Labs  Lab 10/19/18 1832 10/20/18 0146 10/22/18 0615 10/23/18 0554 10/25/18 0421  AST 158* 145* 167* 196* 252*  ALT 55* 46* 61* 71* 108*  ALKPHOS 192* 165* 164* 167* 206*  BILITOT 37.8* 33.4* 33.1* 32.1* 36.5*  PROT 5.8* 5.2* 5.0* 4.8* 5.3*  ALBUMIN 2.5* 2.2* 2.0* 1.9* 2.1*    Recent Labs  Lab 10/20/18 0146 10/25/18 0421  AMMONIA 95* 124*   CBC: Recent Labs  Lab 10/19/18 1832 10/20/18 0146 10/20/18 0636  10/21/18 1754 10/21/18 2137 10/22/18 0615 10/24/18 0450 10/25/18 0421  WBC 5.1 5.0  --   --   --   --   --   --   --   NEUTROABS 3.4  --   --   --   --   --   --   --   --   HGB 9.0* 9.3* 9.8*   < > 11.0* 10.7* 10.3* 10.2* 11.0*  HCT 26.5* 26.8* 28.6*  --   --  31.4*  --   --   --   MCV 99.3 95.0  --   --   --   --   --   --   --   PLT 252 211  --   --   --   --   --   --   --    < > =  values in this interval not displayed.     Recent Results (from the past 240 hour(s))  SARS Coronavirus 2 (CEPHEID- Performed in [facility] lab), Hosp Order     Status: None   Collection Time: 10/19/18  6:39 PM   Specimen: Nasopharyngeal Swab  Result Value Ref Range Status   SARS Coronavirus 2 NEGATIVE NEGATIVE Final    Comment: (NOTE) If result is NEGATIVE SARS-CoV-2 target nucleic acids are NOT DETECTED. The SARS-CoV-2 RNA is generally detectable in upper and lower  respiratory specimens during the acute phase of infection. The lowest  concentration of SARS-CoV-2 viral copies this assay can detect is 250  copies / mL. A negative result does not preclude SARS-CoV-2 infection  and should not be used as the sole basis for treatment or other  patient management decisions.  A negative result may occur with  improper specimen collection / handling, submission of specimen other  than nasopharyngeal swab, presence of viral mutation(s) within the  areas targeted by this assay, and inadequate number of viral copies  (<250 copies / mL). A negative result must be combined with clinical  observations, patient history, and epidemiological information. If result is POSITIVE SARS-CoV-2 target nucleic acids are DETECTED. The SARS-CoV-2 RNA is generally detectable in upper and lower  respiratory specimens dur ing the acute phase of infection.  Positive  results are indicative of active infection with SARS-CoV-2.  Clinical  correlation with patient history and other diagnostic information is  necessary to determine patient infection status.  Positive results do  not rule out bacterial infection or co-infection with other viruses. If result is PRESUMPTIVE POSTIVE SARS-CoV-2 nucleic acids MAY BE PRESENT.   A presumptive positive result was obtained on the submitted specimen  and confirmed on repeat testing.  While  2019 novel coronavirus  (SARS-CoV-2) nucleic acids may be present in the  submitted sample  additional confirmatory testing may be necessary for epidemiological  and / or clinical management purposes  to differentiate between  SARS-CoV-2 and other Sarbecovirus currently known to infect humans.  If clinically indicated additional testing with an alternate test  methodology [phone] is advised. The SARS-CoV-2 RNA is generally  detectable in upper and lower respiratory sp ecimens during the acute  phase of infection. The expected result is Negative. Fact Sheet for Patients:  BoilerBrush.com.cyhttps://www.fda.gov/media/136312/download Fact Sheet for Healthcare Providers: https://pope.com/https://www.fda.gov/media/136313/download This test is not yet approved or cleared by the [place] FDA and has been authorized for detection and/or diagnosis of SARS-CoV-2 by FDA under an Emergency Use Authorization (EUA).  This EUA will remain in effect (meaning this test can be used) for the duration of the COVID-19 declaration under Section 564(b)(1) of the Act, 21 U.S.C. section 360bbb-3(b)(1), unless the authorization is terminated or revoked sooner. Performed at [facility], [address] [place], [place] [address]      Studies: [place] Abdomen Limited  Result Date: 10/24/2018 INDICATION: Ascites. EXAM: ULTRASOUND GUIDED  PARACENTESIS MEDICATIONS: None. COMPLICATIONS: None immediate. PROCEDURE: Abdominal ultrasound obtained to evaluate for ascites/paracentesis. Only a small amount of fluid noted. No paracentesis performed. FINDINGS: No paracentesis performed due to small amount of fluid. Follow-up exam can be obtained as needed. IMPRESSION: No paracentesis performed due to small amount of fluid. Electronically Signed   By: Maisie Fushomas  Register   On: 10/24/2018 11:12    Scheduled Meds: . Gerhardt's butt cream   Topical QID  . lactulose  30 g Oral TID  . pantoprazole  40 mg Oral BID AC  . potassium chloride  40 mEq Oral Once  . prednisoLONE  40 mg Oral QAC breakfast  . rifaximin  550 mg Oral  BID  . thiamine  100 mg Oral Daily  . traZODone  100 mg Oral QHS   Continuous Infusions: . dextrose 5 % with KCl 20 mEq / L      Assessment/Plan:  1. Acute hepatic encephalopathy.  Increased dose of lactulose to 3 times daily dosing.  Had 4 bowel movements yesterday.  Continue Xifaxan.  Ammonia level higher this morning than it was previous.  Spoke with nursing staff about lactulose enemas versus oral. 2. Tachypnea.  Check a chest x-ray to see if she needs aspirated. 3. Alcoholic liver cirrhosis, alcoholic hepatitis, jaundice.  Start prednisolone. 4. Gastrointestinal bleed.  Hemoglobin stable.  Bleeding scan negative.  Hemoglobin stabilized. 5. Hypotension secondary to underlying chronic liver disease 6. Depression on trazodone 7. Hypokalemia replace potassium 8. Appreciate palliative care reconsult because of the patient's decline in status.  Likely will be a candidate for hospice home.  Code Status:     Code Status Orders  (From admission, onward)         Start     Ordered   10/20/18 0012  Do not attempt resuscitation (DNR)  Continuous    Question Answer Comment  In the event of cardiac or respiratory ARREST Do not call a "code blue"   In the event of cardiac or respiratory ARREST Do not perform Intubation, CPR, defibrillation or ACLS   In the event of cardiac or respiratory ARREST Use medication by any route, position, wound care, and other measures to relive pain and suffering. May use oxygen, suction and manual treatment of airway obstruction as needed for comfort.      10/20/18 0012  Code Status History    This patient has a current code status but no historical code status.   Advance Care Planning Activity     Family Communication: Spoke with patient's husband yesterday afternoon and again this morning Disposition Plan: To be determined  Time spent: 27 minutes  [place]

## 2018-10-25 NOTE — Progress Notes (Signed)
Critical labs text paged to on call hospitalist.  CRITICAL VALUE STICKER  CRITICAL VALUE: Chloride 130, Total Bili 36.5  RECEIVER (on-site recipient of call): Dorna Bloom RN  DATE & TIME NOTIFIED: 7/7 0530  MESSENGER (representative from lab):  MD NOTIFIED: A. Seals  TIME OF NOTIFICATION: [phone]  RESPONSE: Text page, pending response

## 2018-10-25 NOTE — Progress Notes (Signed)
Palliative care made aware that family is present and tearful at bedside. Madlyn Frankel, RN

## 2018-10-25 NOTE — Progress Notes (Signed)
Patient removed off of air loss mattress due to malfunction and placed on regular bed. Madlyn Frankel, RN

## 2018-10-25 NOTE — Progress Notes (Signed)
Pt not following commands.  Attempted to give pt her meds crushed in applesauce but pt refused. Moved pt to low air loss bed and attempted x 3 to place rectal tube but the flange will not stay in the rectum.  Pt with significant hemorrhoids. Pt with shallow respirations.  Not interactive.  Cries out when moved and prefers to lay on left side.Dorna Bloom RN

## 2018-10-25 NOTE — Consult Note (Signed)
Pharmacy Antibiotic Note  Shelley [name] is a 53 y.o. female admitted on 10/19/2018 with possible aspiration pneumonia.  Pharmacy has been consulted for Zosyn dosing.  Plan: Zosyn 3.375g IV q8h (4 hour infusion).  Height: 5\' 3"  (160 cm) Weight: 155 lb (70.3 kg) IBW/kg (Calculated) : 52.4  Temp (24hrs), Avg:98.4 F (36.9 C), Min:98.4 F (36.9 C), Max:98.5 F (36.9 C)  Recent Labs  Lab 10/19/18 1832 10/20/18 0146 10/22/18 0615 10/23/18 0554 10/25/18 0421  WBC 5.1 5.0  --   --   --   CREATININE UNABLE TO REPORT DUE TO ICTERUS MJU UNABLE TO REPORT DUE TO ICTERUS  UNABLE TO REPORT DUE TO ICTERUS UNABLE TO REPORT DUE TO ICTERUS UNABLE TO RESULT DUE TO ICTERUS    CrCl cannot be calculated (This lab value cannot be used to calculate CrCl because it is not a number: UNABLE TO RESULT DUE TO ICTERUS).    No Known Allergies  Antimicrobials this admission: Zosyn 7/7 >>      Thank you for allowing pharmacy to be a part of this patient's care.  Harjas Biggins A Huong Luthi 10/25/2018 10:08 AM

## 2018-10-25 NOTE — TOC Progression Note (Signed)
Transition of Care [facility]) - Progression Note    Patient Details  Name: Shelley [name] MRN: [id number] Date of Birth: 1965/10/10  [facility]) CM/SW Contact  Shelbie Hutching, RN Phone Number: 10/25/2018, 1:53 PM  Clinical Narrative:    Family has decided on comfort care and transfer to hospice home.  Family chooses [facility] in [place].  Referral given to [place] with [facility].  There are no beds available at the hospice home today.  RNCM will cont to follow and help with transition to hospice home when bed becomes available.    Expected Discharge Plan: [place] Barriers to Discharge: Hospice Bed not available  Expected Discharge Plan and Services Expected Discharge Plan: [place]                                               Social Determinants of Health (SDOH) Interventions    Readmission Risk Interventions No flowsheet data found.

## 2018-10-26 MED ORDER — MORPHINE SULFATE (PF) 2 MG/ML IV SOLN
2.0000 mg | INTRAVENOUS | Status: AC
  Administered 2018-10-26: 03:00:00 2 mg via INTRAVENOUS
  Filled 2018-10-26: qty 1

## 2018-10-26 MED ORDER — MORPHINE SULFATE (PF) 2 MG/ML IV SOLN
1.0000 mg | Freq: Once | INTRAVENOUS | Status: AC
  Administered 2018-10-26: 11:00:00 1 mg via INTRAVENOUS
  Filled 2018-10-26: qty 1

## 2018-10-26 NOTE — Progress Notes (Signed)
Patient discharged. Chesley Valls S, RN  

## 2018-10-26 NOTE — Progress Notes (Signed)
Palliative: Shelley [name], Shelley [name], is lying quietly in bed.  She appears acutely ill, dying.  She is unable to make her basic needs known or respond to me in any meaningful way.  There is no family at bedside at this time.  Conference with nursing staff related to patient condition, needs, disposition to residential hospice.  Plan: Disposition to residential hospice with [facility] location today.  62 minutes Quinn Axe, NP Palliative Medicine Team Team Phone # [phone] Greater than 50% of this time was spent counseling and coordinating care related to the above assessment and plan.

## 2018-10-26 NOTE — Progress Notes (Signed)
Report given to hospice home. EMS called for transport. Madlyn Frankel, RN

## 2018-10-26 NOTE — TOC Transition Note (Signed)
Transition of Care [facility]) - CM/SW Discharge Note   Patient Details  Name: RENNEE COYNE MRN: [id number] Date of Birth: 01-Feb-1966  Transition of Care [facility]) CM/SW Contact:  Shelbie Hutching, RN Phone Number: 10/26/2018, 9:03 AM   Clinical Narrative:     Marcene Brawn with [facility] has arranged for patient to go to the hospice home in [place].  Husband Karrie Doffing will sign consent paper work this morning.  Patient will be transferred to Hospice home by [facility].    Final next level of care: [place] Barriers to Discharge: Barriers Resolved   Patient Goals and CMS Choice   CMS Medicare.gov Compare Post Acute Care list provided to:: Patient Represenative (must comment)(husband) Choice offered to / list presented to : Spouse  Discharge Placement              Patient chooses bed at: [facility] in [place]) Patient to be transferred to facility by: [facility] Name of family member notified: Sherrie Sport- Husband Patient and family notified of of transfer: 10/26/18  Discharge Plan and Services                                     Social Determinants of Health (SDOH) Interventions     Readmission Risk Interventions No flowsheet data found.

## 2018-10-26 NOTE — Discharge Summary (Signed)
[employer] - [place] [facility]   PATIENT NAME: Shelley [name]    MR#:  [id number]  DATE OF BIRTH:  29-Mar-1966  DATE OF ADMISSION:  10/19/2018   ADMITTING PHYSICIAN: Houston SirenVivek J Sainani, MD  DATE OF DISCHARGE: 10/26/2018  PRIMARY CARE PHYSICIAN: [facility]   ADMISSION DIAGNOSIS:  Hypokalemia [E87.6] Cirrhosis (HCC) [K74.60] Bright red blood per rectum [K62.5] Alcoholic cirrhosis, unspecified whether ascites present (HCC) [K70.30] DISCHARGE DIAGNOSIS:  Active Problems:   Bright red blood per rectum   Alcoholic cirrhosis (HCC)   Goals of care, counseling/discussion   Palliative care by specialist   Alcoholic hepatitis with ascites   Encounter for hospice care discussion  SECONDARY DIAGNOSIS:   Past Medical History:  Diagnosis Date  . Anxiety   . Bipolar 1 disorder (HCC)   . Chronic kidney disease   . Hypertension   . PTSD (post-traumatic stress disorder)    HOSPITAL COURSE:  Chief complaint; rectal bleeding and jaundice  History of presenting complaint; Shelley LoraMargaret Ocheltree  is a 53 y.o. female with a known history of bipolar disorder, anxiety, alcohol abuse, PTSD, hypertension recently diagnosed alcoholic liver cirrhosis who was hospitalized at [facility] for about 2 and half to 3 weeks now returns back to the hospital today complaining of rectal bleeding.  Patient was admitted to [facility] for worsening liver failure secondary to alcohol abuse and underwent an upper GI endoscopy which showed no esophageal varices but some gastritis and a colonoscopy which showed just some hemorrhoids although not able to access these records. This is based off what the ER physician spoke to the hepatology team at [facility].  Patient presented with worsening rectal bleeding and was noted to be slightly hypotensive but hemoglobin remained stable.  Hospitalist services were contacted for admission.   Hospital course; 1. Acute hepatic encephalopathy.  Increased dose of  lactulose to 3 times daily dosing recently.  Had 4 bowel movements recently.  This morning patient very lethargic and minimally responsive.  Arrangements already made for transfer to hospice house today.  I discussed with husband present at bedside who confirmed plan for transfer to inpatient hospice with focus on keeping patient comfortable going forward.  Patient also had tachycardia which appear resolved. 2. Alcoholic liver cirrhosis, alcoholic hepatitis, jaundice.  Patient being discharged to hospice home. 3. Gastrointestinal bleed.  Hemoglobin stable.  Bleeding scan negative.  Hemoglobin stabilized. 4. Depression on trazodone 5. Hypokalemia; previously replaced  Appreciate palliative care reconsult because of the patient's decline in status.  Patient felt to be a good candidate for hospice.  Being discharged to hospice home today.  DISCHARGE CONDITIONS:  Terminal CONSULTS OBTAINED:  Treatment Team:  Sung AmabileSakai, Isami, DO Axel Filleramirez, Armando, MD DRUG ALLERGIES:  No Known Allergies DISCHARGE MEDICATIONS:   Allergies as of 10/26/2018   No Known Allergies     Medication List    STOP taking these medications   lactulose 10 GM/15ML solution Commonly known as: CHRONULAC   traZODone 100 MG tablet Commonly known as: DESYREL   Vitamin B1 100 MG Tabs        DISCHARGE INSTRUCTIONS:   DIET:  Pleasure feeding as tolerated DISCHARGE CONDITION:  Critical ACTIVITY:  Bedrest OXYGEN:  Home Oxygen: No.  Oxygen Delivery: room air DISCHARGE LOCATION:  Hospice home  If you experience worsening of your admission symptoms, develop shortness of breath, life threatening emergency, suicidal or homicidal thoughts you must seek medical attention immediately by calling 911 or calling your MD immediately  if symptoms  less severe.  You Must read complete instructions/literature along with all the possible adverse reactions/side effects for all the Medicines you take and that have been prescribed to  you. Take any new Medicines after you have completely understood and accpet all the possible adverse reactions/side effects.   Please note  You were cared for by a hospitalist during your hospital stay. If you have any questions about your discharge medications or the care you received while you were in the hospital after you are discharged, you can call the unit and asked to speak with the hospitalist on call if the hospitalist that took care of you is not available. Once you are discharged, your primary care physician will handle any further medical issues. Please note that NO REFILLS for any discharge medications will be authorized once you are discharged, as it is imperative that you return to your primary care physician (or establish a relationship with a primary care physician if you do not have one) for your aftercare needs so that they can reassess your need for medications and monitor your lab values.    On the day of Discharge:  VITAL SIGNS:  Blood pressure (!) 84/41, pulse 86, temperature (!) 97.5 F (36.4 C), temperature source Oral, resp. rate 18, height 5\' 3"  (1.6 m), weight 70.3 kg, SpO2 100 %. PHYSICAL EXAMINATION:  GENERAL:  53 y.o.-year-old patient lying in the bed with no acute distress.  Very lethargic.  Minimally responsive. EYES: Pupils equal, round, reactive to light and accommodation.  Jaundiced . Extraocular muscles intact.  HEENT: Head atraumatic, normocephalic. Oropharynx and nasopharynx clear.  NECK:  Supple, no jugular venous distention. No thyroid enlargement, no tenderness.  LUNGS: Normal breath sounds bilaterally, no wheezing, rales,rhonchi or crepitation. No use of accessory muscles of respiration.  CARDIOVASCULAR: S1, S2 normal. No murmurs, rubs, or gallops.  ABDOMEN: Soft, non-tender, non-distended. Bowel sounds present. No organomegaly or mass.  EXTREMITIES: No pedal edema, cyanosis, or clubbing.  NEUROLOGIC: Patient very lethargic.  Full neuro exam not done  at this time.   PSYCHIATRIC: The patient is alert and oriented x 3.  SKIN: Jaundiced.  No obvious rash, lesion, or ulcer.   DATA REVIEW:   CBC Recent Labs  Lab 10/20/18 0146  10/21/18 2137  10/25/18 0421  WBC 5.0  --   --   --   --   HGB 9.3*   < > 10.7*   < > 11.0*  HCT 26.8*   < > 31.4*  --   --   PLT 211  --   --   --   --    < > = values in this interval not displayed.    Chemistries  Recent Labs  Lab 10/25/18 0421  NA 150*  K 3.2*  CL >130*  CO2 12*  GLUCOSE 102*  BUN 12  CREATININE UNABLE TO RESULT DUE TO ICTERUS  CALCIUM 9.0  MG 2.1  AST 252*  ALT 108*  ALKPHOS 206*  BILITOT 36.5*     Microbiology Results  Results for orders placed or performed during the hospital encounter of 10/19/18  SARS Coronavirus 2 (CEPHEID- Performed in [facility] hospital lab), Hosp Order     Status: None   Collection Time: 10/19/18  6:39 PM   Specimen: Nasopharyngeal Swab  Result Value Ref Range Status   SARS Coronavirus 2 NEGATIVE NEGATIVE Final    Comment: (NOTE) If result is NEGATIVE SARS-CoV-2 target nucleic acids are NOT DETECTED. The SARS-CoV-2 RNA is generally detectable  in upper and lower  respiratory specimens during the acute phase of infection. The lowest  concentration of SARS-CoV-2 viral copies this assay can detect is 250  copies / mL. A negative result does not preclude SARS-CoV-2 infection  and should not be used as the sole basis for treatment or other  patient management decisions.  A negative result may occur with  improper specimen collection / handling, submission of specimen other  than nasopharyngeal swab, presence of viral mutation(s) within the  areas targeted by this assay, and inadequate number of viral copies  (<250 copies / mL). A negative result must be combined with clinical  observations, patient history, and epidemiological information. If result is POSITIVE SARS-CoV-2 target nucleic acids are DETECTED. The SARS-CoV-2 RNA is generally  detectable in upper and lower  respiratory specimens dur ing the acute phase of infection.  Positive  results are indicative of active infection with SARS-CoV-2.  Clinical  correlation with patient history and other diagnostic information is  necessary to determine patient infection status.  Positive results do  not rule out bacterial infection or co-infection with other viruses. If result is PRESUMPTIVE POSTIVE SARS-CoV-2 nucleic acids MAY BE PRESENT.   A presumptive positive result was obtained on the submitted specimen  and confirmed on repeat testing.  While 2019 novel coronavirus  (SARS-CoV-2) nucleic acids may be present in the submitted sample  additional confirmatory testing may be necessary for epidemiological  and / or clinical management purposes  to differentiate between  SARS-CoV-2 and other Sarbecovirus currently known to infect humans.  If clinically indicated additional testing with an alternate test  methodology [id number]) is advised. The SARS-CoV-2 RNA is generally  detectable in upper and lower respiratory sp ecimens during the acute  phase of infection. The expected result is Negative. Fact Sheet for Patients:  StrictlyIdeas.no Fact Sheet for Healthcare Providers: BankingDealers.co.za This test is not yet approved or cleared by the [place] FDA and has been authorized for detection and/or diagnosis of SARS-CoV-2 by FDA under an Emergency Use Authorization (EUA).  This EUA will remain in effect (meaning this test can be used) for the duration of the COVID-19 declaration under Section 564(b)(1) of the Act, 21 U.S.C. section 360bbb-3(b)(1), unless the authorization is terminated or revoked sooner. Performed at [facility], [address], [place], [place] [address]     RADIOLOGY:  No results found.   Management plans discussed with the patient, family and they are in agreement.  CODE STATUS: DNR    TOTAL TIME TAKING CARE OF THIS PATIENT: 38 minutes.    Kahlin Mark M.D on 10/26/2018 at 9:17 AM  Between 7am to 6pm - Pager - [phone]  After 6pm go to [email or web address] - [employer] [facility]  Office  [phone]  CC: Primary care physician; Center, [facility]   Note: This dictation was prepared with Dragon dictation along with smaller phrase technology. Any transcriptional errors that result from this process are unintentional.

## 2018-11-17 ENCOUNTER — Ambulatory Visit: Payer: Self-pay | Admitting: Nurse Practitioner

## 2018-11-19 DEATH — deceased

## 2021-04-09 IMAGING — NM NUCLEAR MEDICINE GASTROINTESTINAL BLEEDING STUDY
2 series · 12 of 12 positions shown · non-contrast
Comparison: None.

CLINICAL DATA: GI bleed.

EXAM:
NUCLEAR MEDICINE GASTROINTESTINAL BLEEDING SCAN
TECHNIQUE: Sequential abdominal images were obtained following intravenous
administration of Cc-YYm labeled red blood cells.
RADIOPHARMACEUTICALS:  22.813 mCi Cc-YYm pertechnetate in-vitro
labeled red cells.

[Series 1000: hour 1 gi bleed · 4.80mm/px · 6 of 60 frames shown]
[frame 6/60]
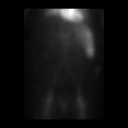
[frame 16/60]
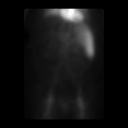
[frame 26/60]
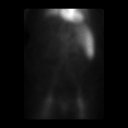
[frame 36/60]
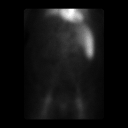
[frame 46/60]
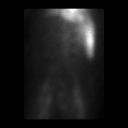
[frame 56/60]
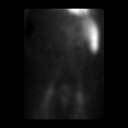

[Series 1000: gi bleed hour 2 · 4.80mm/px · 6 of 60 frames shown]
[frame 6/60]
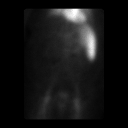
[frame 16/60]
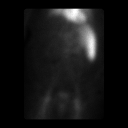
[frame 26/60]
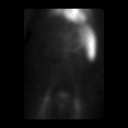
[frame 36/60]
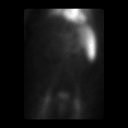
[frame 46/60]
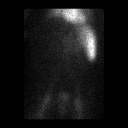
[frame 56/60]
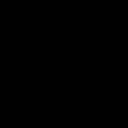

[12 of 12 positions shown; findings below may reference images not displayed]

FINDINGS: No active source of GI bleed identified. There is activity in the
lower pelvis from the beginning of the study which persists
throughout the study, not consistent with an active GI bleed.
IMPRESSION: No active GI bleed identified.

## 2021-04-12 IMAGING — US ULTRASOUND ABDOMEN LIMITED
1 series · 8 of 8 positions shown · non-contrast
Comparison: none

INDICATION: Ascites.

EXAM:
ULTRASOUND GUIDED  PARACENTESIS
MEDICATIONS:
None.
COMPLICATIONS:
None immediate.
PROCEDURE:
Abdominal ultrasound obtained to evaluate for ascites/paracentesis.
Only a small amount of fluid noted. No paracentesis performed.

[Series 1: ultrasound abdomen limited · 8 of 8 slices shown]
[im 1/8]
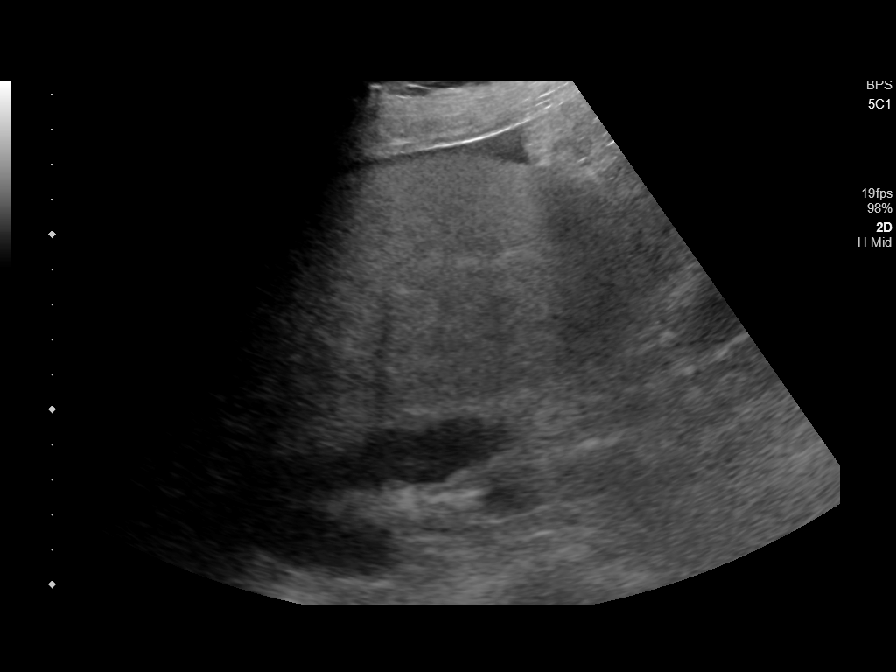
[im 2/8]
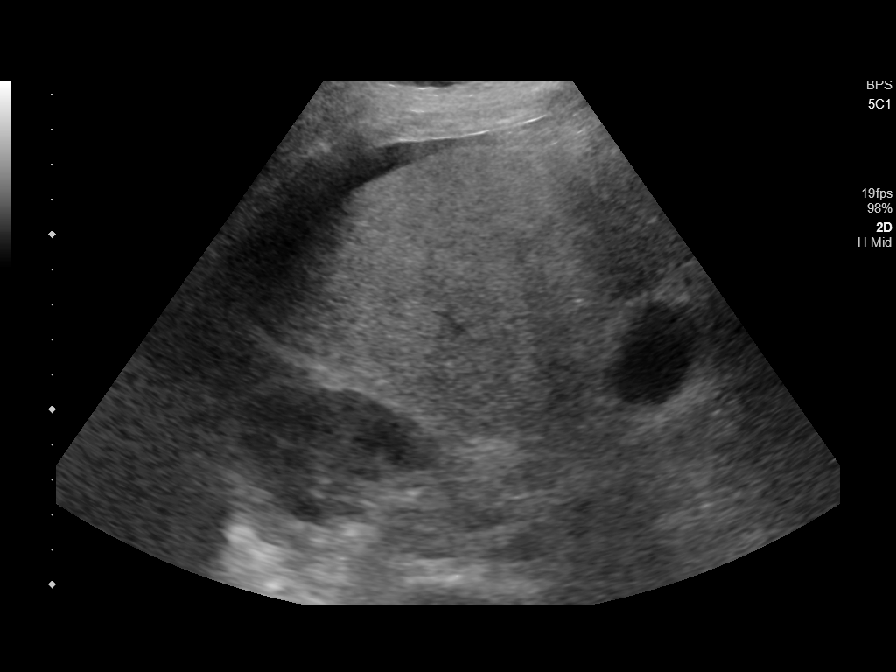
[im 3/8]
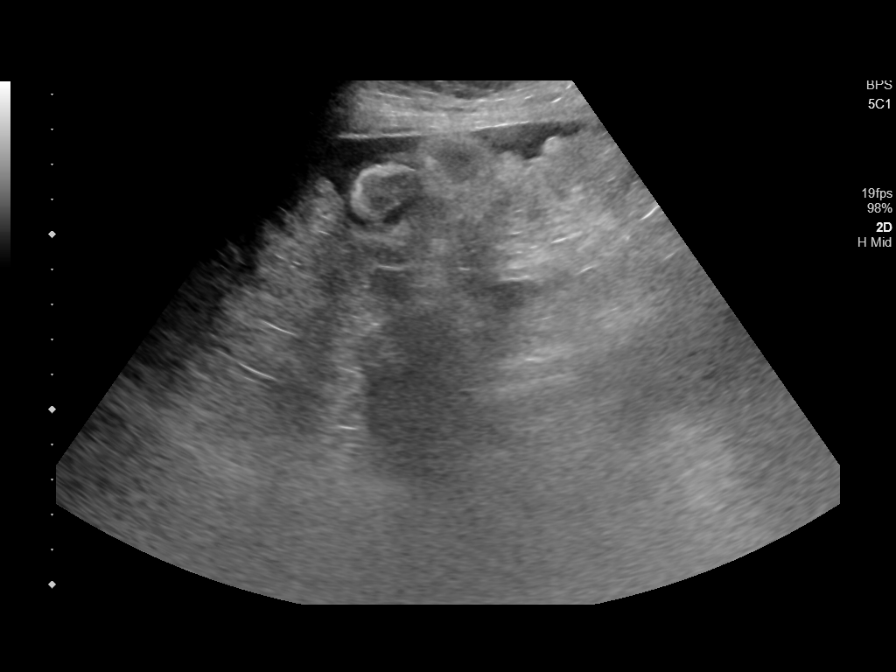
[im 4/8]
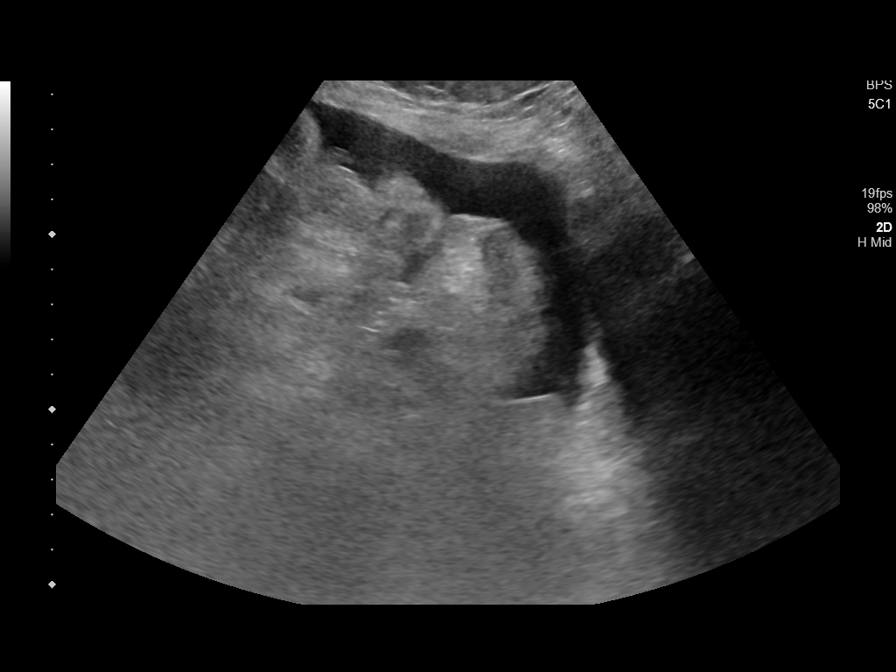
[im 5/8]
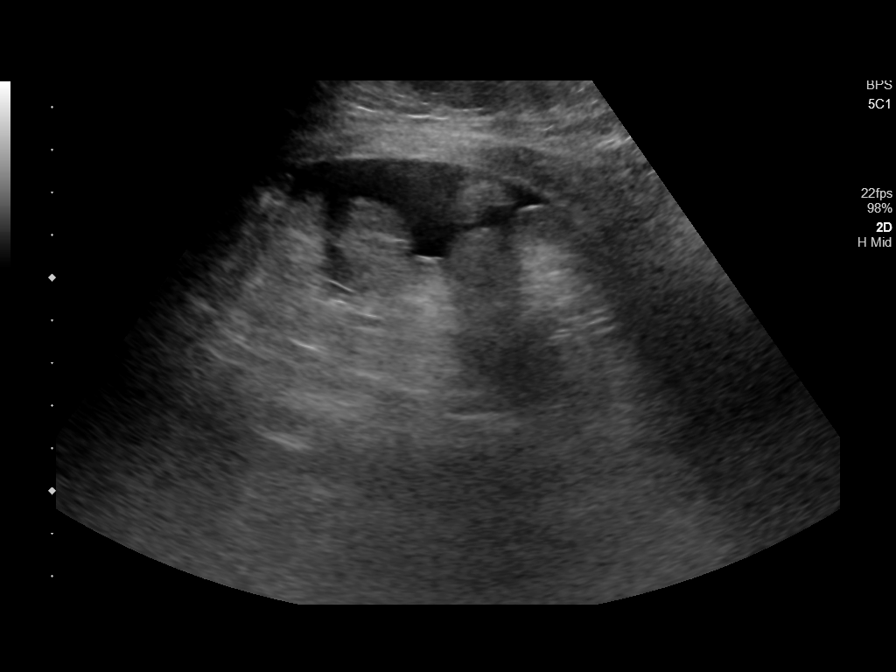
[im 6/8]
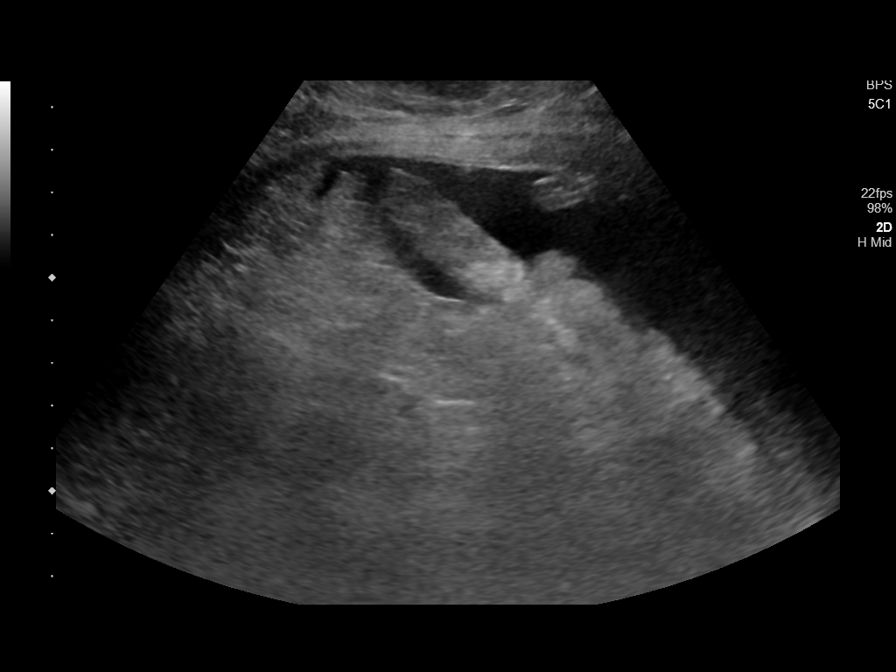
[im 7/8]
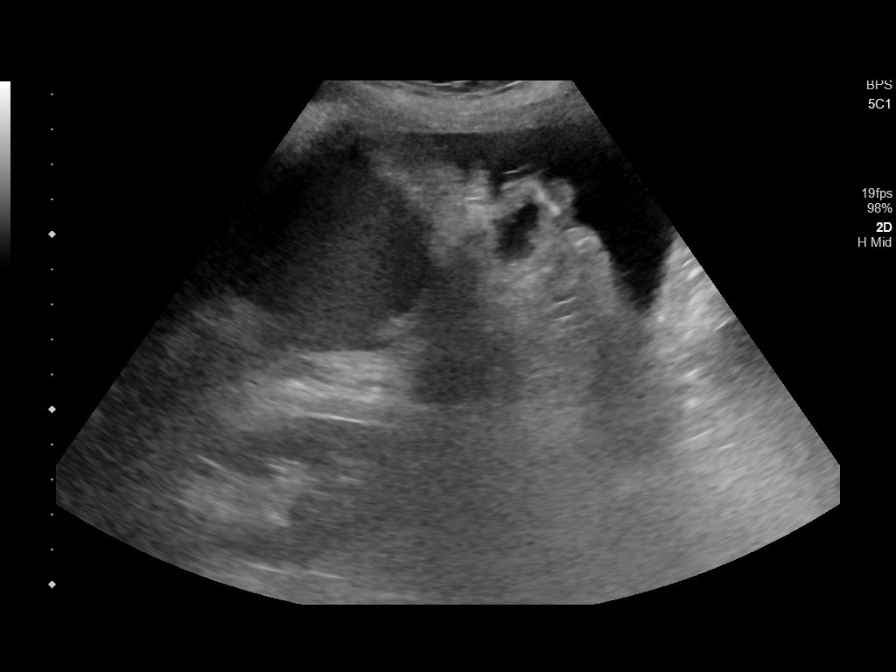
[im 8/8]
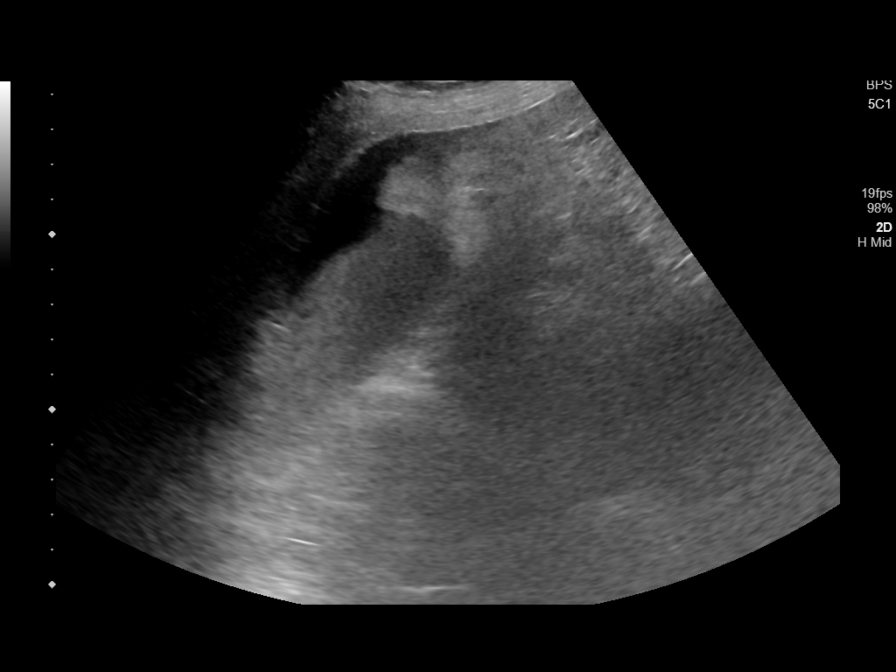

[8 of 8 positions shown; findings below may reference images not displayed]

FINDINGS: No paracentesis performed due to small amount of fluid. Follow-up
exam can be obtained as needed.
IMPRESSION: No paracentesis performed due to small amount of fluid.
